# Patient Record
Sex: Female | Born: 1960 | Race: White | Hispanic: No | Marital: Married | Smoking: Current every day smoker
Health system: Southern US, Community
[De-identification: ages and names within clinical notes are randomized; demographics above are authoritative.]

## PROBLEM LIST (undated history)

## (undated) DIAGNOSIS — N200 Calculus of kidney: Secondary | ICD-10-CM

## (undated) HISTORY — PX: FUNCTIONAL ENDOSCOPIC SINUS SURGERY: SUR616

## (undated) HISTORY — PX: BREAST LUMPECTOMY: SHX2

---

## 2010-06-06 HISTORY — PX: BREAST BIOPSY: SHX20

## 2015-12-23 ENCOUNTER — Other Ambulatory Visit: Payer: Self-pay | Admitting: Student

## 2015-12-23 DIAGNOSIS — Z1239 Encounter for other screening for malignant neoplasm of breast: Secondary | ICD-10-CM

## 2016-12-30 ENCOUNTER — Encounter: Payer: Self-pay | Admitting: *Deleted

## 2017-01-02 ENCOUNTER — Encounter
Admission: RE | Disposition: A | Discharge status: Home / Self Care | Payer: Self-pay | Source: Ambulatory Visit | Attending: Unknown Physician Specialty

## 2017-01-02 ENCOUNTER — Encounter: Payer: Self-pay | Admitting: *Deleted

## 2017-01-02 ENCOUNTER — Ambulatory Visit: Payer: BLUE CROSS/BLUE SHIELD | Admitting: Anesthesiology

## 2017-01-02 ENCOUNTER — Ambulatory Visit
Admission: RE | Admit: 2017-01-02 | Discharge: 2017-01-02 | Disposition: A | Discharge status: Home / Self Care | Payer: BLUE CROSS/BLUE SHIELD | Source: Ambulatory Visit | Attending: Unknown Physician Specialty | Admitting: Unknown Physician Specialty

## 2017-01-02 DIAGNOSIS — K640 First degree hemorrhoids: Secondary | ICD-10-CM | POA: Diagnosis not present

## 2017-01-02 DIAGNOSIS — F17210 Nicotine dependence, cigarettes, uncomplicated: Secondary | ICD-10-CM | POA: Diagnosis not present

## 2017-01-02 DIAGNOSIS — Z1211 Encounter for screening for malignant neoplasm of colon: Secondary | ICD-10-CM | POA: Insufficient documentation

## 2017-01-02 DIAGNOSIS — Z87442 Personal history of urinary calculi: Secondary | ICD-10-CM | POA: Insufficient documentation

## 2017-01-02 DIAGNOSIS — Z79899 Other long term (current) drug therapy: Secondary | ICD-10-CM | POA: Insufficient documentation

## 2017-01-02 DIAGNOSIS — K529 Noninfective gastroenteritis and colitis, unspecified: Secondary | ICD-10-CM | POA: Diagnosis not present

## 2017-01-02 DIAGNOSIS — K5730 Diverticulosis of large intestine without perforation or abscess without bleeding: Secondary | ICD-10-CM | POA: Diagnosis not present

## 2017-01-02 DIAGNOSIS — D120 Benign neoplasm of cecum: Secondary | ICD-10-CM | POA: Diagnosis not present

## 2017-01-02 HISTORY — PX: COLONOSCOPY WITH PROPOFOL: SHX5780

## 2017-01-02 HISTORY — DX: Calculus of kidney: N20.0

## 2017-01-02 SURGERY — COLONOSCOPY WITH PROPOFOL
Anesthesia: General

## 2017-01-02 MED ORDER — PROPOFOL 10 MG/ML IV BOLUS
INTRAVENOUS | Status: DC | PRN
  Administered 2017-01-02: 30 mg via INTRAVENOUS
  Administered 2017-01-02 (×2): 20 mg via INTRAVENOUS
  Administered 2017-01-02: 30 mg via INTRAVENOUS

## 2017-01-02 MED ORDER — PROPOFOL 500 MG/50ML IV EMUL
INTRAVENOUS | Status: AC
  Filled 2017-01-02: qty 50

## 2017-01-02 MED ORDER — SODIUM CHLORIDE 0.9 % IV SOLN
INTRAVENOUS | Status: DC
  Administered 2017-01-02: 14:00:00 via INTRAVENOUS

## 2017-01-02 MED ORDER — PROPOFOL 10 MG/ML IV BOLUS
INTRAVENOUS | Status: AC
  Filled 2017-01-02: qty 20

## 2017-01-02 MED ORDER — LIDOCAINE HCL (CARDIAC) 20 MG/ML IV SOLN
INTRAVENOUS | Status: DC | PRN
  Administered 2017-01-02: 30 mg via INTRAVENOUS

## 2017-01-02 MED ORDER — SODIUM CHLORIDE 0.9 % IV SOLN: INTRAVENOUS | Status: DC

## 2017-01-02 MED ORDER — PROPOFOL 500 MG/50ML IV EMUL
INTRAVENOUS | Status: DC | PRN
  Administered 2017-01-02: 140 ug/kg/min via INTRAVENOUS

## 2017-01-02 NOTE — Anesthesia Post-op Follow-up Note (Cosign Needed)
Anesthesia QCDR form completed.        

## 2017-01-02 NOTE — Transfer of Care (Signed)
Immediate Anesthesia Transfer of Care Note  Patient: Nancy Ryan  Procedure(s) Performed: Procedure(s): COLONOSCOPY WITH PROPOFOL (N/A)  Patient Location: PACU  Anesthesia Type:General  Level of Consciousness: sedated  Airway & Oxygen Therapy: Patient Spontanous Breathing and Patient connected to nasal cannula oxygen  Post-op Assessment: Report given to RN and Post -op Vital signs reviewed and stable  Post vital signs: Reviewed and stable  Last Vitals:  Vitals:   01/02/17 1339  BP: 129/81  Pulse: 85  Resp: 16  Temp: 36.7 C    Last Pain:  Vitals:   01/02/17 1339  TempSrc: Tympanic  PainSc: 8          Complications: No apparent anesthesia complications

## 2017-01-02 NOTE — Anesthesia Procedure Notes (Signed)
Date/Time: 01/02/2017 2:03 PM Performed by: Johnna Acosta Pre-anesthesia Checklist: Patient identified, Emergency Drugs available, Suction available, Patient being monitored and Timeout performed Patient Re-evaluated:Patient Re-evaluated prior to induction Oxygen Delivery Method: Nasal cannula

## 2017-01-02 NOTE — Op Note (Signed)
[facility] Gastroenterology Patient Name: Nancy Ryan Procedure Date: 01/02/2017 1:59 PM MRN: [id number] Account #: [id number] Date of Birth: 1960/10/14 Admit Type: Outpatient Age: 56 Room: [facility] ENDO ROOM 3 Gender: Female Note Status: Finalized Procedure:            Colonoscopy Indications:          Screening for colorectal malignant neoplasm Providers:            Manya Silvas, MD Referring MD:         Irven Easterly. Kary Kos, MD (Referring MD) Medicines:            Propofol per Anesthesia Complications:        No immediate complications. Procedure:            Pre-Anesthesia Assessment:                       - After reviewing the risks and benefits, the patient                        was deemed in satisfactory condition to undergo the                        procedure.                       After obtaining informed consent, the colonoscope was                        passed under direct vision. Throughout the procedure,                        the patient's blood pressure, pulse, and oxygen                        saturations were monitored continuously. The                        Colonoscope was introduced through the anus and                        advanced to the the cecum, identified by appendiceal                        orifice and ileocecal valve. The colonoscopy was                        performed without difficulty. The patient tolerated the                        procedure well. The quality of the bowel preparation                        was good. Findings:      Two sessile polyps were found in the cecum. The polyps were diminutive       in size. These polyps were removed with a jumbo cold forceps. Resection       and retrieval were complete.      A single small-mouthed diverticulum was found in the transverse colon.      Internal hemorrhoids were found during endoscopy. The hemorrhoids were       small  and Grade I (internal hemorrhoids that do not  prolapse).      The exam was otherwise without abnormality. Impression:           - Two diminutive polyps in the cecum, removed with a                        jumbo cold forceps. Resected and retrieved.                       - Diverticulosis in the transverse colon.                       - Internal hemorrhoids.                       - The examination was otherwise normal. Recommendation:       - Await pathology results. Manya Silvas, MD 01/02/2017 2:36:50 PM This report has been signed electronically. Number of Addenda: 0 Note Initiated On: 01/02/2017 1:59 PM Scope Withdrawal Time: 0 hours 11 minutes 4 seconds  Total Procedure Duration: 0 hours 22 minutes 19 seconds       [facility]

## 2017-01-02 NOTE — H&P (Signed)
   Primary Care Physician:  Wayland Denis, PA-C Primary Gastroenterologist:  Dr. Vira Agar  Pre-Procedure History & Physical: HPI:  Nancy Ryan is a 56 y.o. female is here for an colonoscopy.   Past Medical History:  Diagnosis Date  . Kidney stone     Past Surgical History:  Procedure Laterality Date  . BREAST LUMPECTOMY Right   . FUNCTIONAL ENDOSCOPIC SINUS SURGERY      Prior to Admission medications   Medication Sig Start Date End Date Taking? Authorizing Provider  atorvastatin (LIPITOR) 10 MG tablet Take 10 mg by mouth daily.   Yes [provider]    Allergies as of 09/20/2016  . (Not on File)    History reviewed. No pertinent family history.  Social History   Social History  . Marital status: Unknown    Spouse name: N/A  . Number of children: N/A  . Years of education: N/A   Occupational History  . Not on file.   Social History Main Topics  . Smoking status: Current Every Day Smoker    Packs/day: 0.50    Years: 20.00  . Smokeless tobacco: Never Used  . Alcohol use No  . Drug use: No  . Sexual activity: Not on file   Other Topics Concern  . Not on file   Social History Narrative  . No narrative on file    Review of Systems: See HPI, otherwise negative ROS  Physical Exam: BP 129/81   Pulse 85   Temp 98 F (36.7 C) (Tympanic)   Resp 16   Ht 5' (1.524 m)   Wt 58.1 kg (128 lb)   LMP 01/02/2009   SpO2 97%   BMI 25.00 kg/m  General:   Alert,  pleasant and cooperative in NAD Head:  Normocephalic and atraumatic. Neck:  Supple; no masses or thyromegaly. Lungs:  Clear throughout to auscultation.    Heart:  Regular rate and rhythm. Abdomen:  Soft, nontender and nondistended. Normal bowel sounds, without guarding, and without rebound.   Neurologic:  Alert and  oriented x4;  grossly normal neurologically.  Impression/Plan: Nancy Ryan is here for an colonoscopy to be performed for colon screening.  Risks, benefits, limitations, and  alternatives regarding  colonoscopy have been reviewed with the patient.  Questions have been answered.  All parties agreeable.   Gaylyn Cheers, MD  01/02/2017, 2:01 PM

## 2017-01-02 NOTE — Anesthesia Preprocedure Evaluation (Signed)
Anesthesia Evaluation  Patient identified by MRN, date of birth, ID band Patient awake    Reviewed: Allergy & Precautions, NPO status , Patient's Chart, lab work & pertinent test results  History of Anesthesia Complications Negative for: history of anesthetic complications  Airway Mallampati: III  TM Distance: >3 FB Neck ROM: Full    Dental no notable dental hx.    Pulmonary neg sleep apnea, neg COPD, Current Smoker,    breath sounds clear to auscultation- rhonchi (-) wheezing      Cardiovascular Exercise Tolerance: Good (-) hypertension(-) CAD and (-) Past MI  Rhythm:Regular Rate:Normal - Systolic murmurs and - Diastolic murmurs    Neuro/Psych negative neurological ROS  negative psych ROS   GI/Hepatic negative GI ROS, Neg liver ROS,   Endo/Other  negative endocrine ROSneg diabetes  Renal/GU Renal disease: hx of nephrolithiasis.     Musculoskeletal negative musculoskeletal ROS (+)   Abdominal (+) - obese,   Peds  Hematology negative hematology ROS (+)   Anesthesia Other Findings Past Medical History: No date: Kidney stone   Reproductive/Obstetrics                             Anesthesia Physical Anesthesia Plan  ASA: II  Anesthesia Plan: General   Post-op Pain Management:    Induction: Intravenous  PONV Risk Score and Plan: 1 and Propofol infusion  Airway Management Planned: Natural Airway  Additional Equipment:   Intra-op Plan:   Post-operative Plan:   Informed Consent: I have reviewed the patients History and Physical, chart, labs and discussed the procedure including the risks, benefits and alternatives for the proposed anesthesia with the patient or authorized representative who has indicated his/her understanding and acceptance.   Dental advisory given  Plan Discussed with: CRNA and Anesthesiologist  Anesthesia Plan Comments:         Anesthesia Quick  Evaluation

## 2017-01-03 ENCOUNTER — Encounter: Payer: Self-pay | Admitting: Unknown Physician Specialty

## 2017-01-03 NOTE — Anesthesia Postprocedure Evaluation (Signed)
Anesthesia Post Note  Patient: Nancy Ryan  Procedure(s) Performed: Procedure(s) (LRB): COLONOSCOPY WITH PROPOFOL (N/A)  Patient location during evaluation: PACU Anesthesia Type: General Level of consciousness: awake and alert Pain management: pain level controlled Vital Signs Assessment: post-procedure vital signs reviewed and stable Respiratory status: spontaneous breathing, nonlabored ventilation, respiratory function stable and patient connected to nasal cannula oxygen Cardiovascular status: blood pressure returned to baseline and stable Postop Assessment: no signs of nausea or vomiting Anesthetic complications: no     Last Vitals:  Vitals:   01/02/17 1458 01/02/17 1508  BP: 108/69 112/68  Pulse: 68 67  Resp: 16 16  Temp:      Last Pain:  Vitals:   01/02/17 1438  TempSrc: Tympanic  PainSc:                  Molli Barrows

## 2017-01-05 LAB — SURGICAL PATHOLOGY

## 2017-12-25 ENCOUNTER — Other Ambulatory Visit: Payer: Self-pay | Admitting: Student

## 2017-12-25 DIAGNOSIS — Z1231 Encounter for screening mammogram for malignant neoplasm of breast: Secondary | ICD-10-CM

## 2018-12-11 ENCOUNTER — Other Ambulatory Visit: Payer: Self-pay | Admitting: Student

## 2018-12-11 DIAGNOSIS — Z1231 Encounter for screening mammogram for malignant neoplasm of breast: Secondary | ICD-10-CM

## 2018-12-19 ENCOUNTER — Ambulatory Visit
Admission: RE | Admit: 2018-12-19 | Discharge: 2018-12-19 | Disposition: A | Discharge status: Home / Self Care | Payer: 59 | Source: Ambulatory Visit | Attending: Student | Admitting: Student

## 2018-12-19 ENCOUNTER — Other Ambulatory Visit: Payer: Self-pay

## 2018-12-19 ENCOUNTER — Encounter (INDEPENDENT_AMBULATORY_CARE_PROVIDER_SITE_OTHER): Payer: Self-pay

## 2018-12-19 DIAGNOSIS — Z1231 Encounter for screening mammogram for malignant neoplasm of breast: Secondary | ICD-10-CM | POA: Diagnosis present

## 2019-09-01 ENCOUNTER — Ambulatory Visit: Payer: 59 | Attending: Internal Medicine

## 2019-09-01 ENCOUNTER — Other Ambulatory Visit: Payer: Self-pay

## 2019-09-01 DIAGNOSIS — Z23 Encounter for immunization: Secondary | ICD-10-CM

## 2019-09-01 NOTE — Progress Notes (Signed)
   Covid-19 Vaccination Clinic  Name:  Nancy Ryan    MRN: [id number] DOB: 17-Jun-1960  09/01/2019  Ms. Vital was observed post Covid-19 immunization for 15 minutes without incident. She was provided with Vaccine Information Sheet and instruction to access the V-Safe system.   [name] was instructed to call 911 with any severe reactions post vaccine: Marland Kitchen Difficulty breathing  . Swelling of face and throat  . A fast heartbeat  . A bad rash all over body  . Dizziness and weakness   Immunizations Administered    Name Date Dose VIS Date Route   Pfizer COVID-19 Vaccine 09/01/2019 10:26 AM 0.3 mL 05/17/2019 Intramuscular   Manufacturer: [employer], [employer]   Lot: [id number]   [place]: [id number]

## 2019-09-24 ENCOUNTER — Ambulatory Visit: Payer: 59 | Attending: Internal Medicine

## 2019-09-24 DIAGNOSIS — Z23 Encounter for immunization: Secondary | ICD-10-CM

## 2019-09-24 NOTE — Progress Notes (Signed)
   Covid-19 Vaccination Clinic  Name:  Nancy Ryan    MRN: [id number] DOB: Jan 13, 1961  09/24/2019  [name] was observed post Covid-19 immunization for 15 minutes without incident. She was provided with Vaccine Information Sheet and instruction to access the V-Safe system.   [name] was instructed to call 911 with any severe reactions post vaccine: Marland Kitchen Difficulty breathing  . Swelling of face and throat  . A fast heartbeat  . A bad rash all over body  . Dizziness and weakness   Immunizations Administered    Name Date Dose VIS Date Route   Pfizer COVID-19 Vaccine 09/24/2019  3:58 PM 0.3 mL 07/31/2018 Intramuscular   Manufacturer: [place]   Lot: [id number]   [place]: [id number]

## 2019-12-10 ENCOUNTER — Other Ambulatory Visit: Payer: Self-pay | Admitting: Student

## 2019-12-10 DIAGNOSIS — Z1231 Encounter for screening mammogram for malignant neoplasm of breast: Secondary | ICD-10-CM

## 2019-12-24 ENCOUNTER — Ambulatory Visit
Admission: RE | Admit: 2019-12-24 | Discharge: 2019-12-24 | Disposition: A | Discharge status: Home / Self Care | Payer: 59 | Source: Ambulatory Visit | Attending: Student | Admitting: Student

## 2019-12-24 ENCOUNTER — Encounter (INDEPENDENT_AMBULATORY_CARE_PROVIDER_SITE_OTHER): Payer: Self-pay

## 2019-12-24 ENCOUNTER — Other Ambulatory Visit: Payer: Self-pay

## 2019-12-24 DIAGNOSIS — Z1231 Encounter for screening mammogram for malignant neoplasm of breast: Secondary | ICD-10-CM

## 2021-01-30 ENCOUNTER — Ambulatory Visit
Admission: EM | Admit: 2021-01-30 | Discharge: 2021-01-30 | Disposition: A | Discharge status: Home / Self Care | Payer: 59 | Attending: Family Medicine | Admitting: Family Medicine

## 2021-01-30 ENCOUNTER — Other Ambulatory Visit: Payer: Self-pay

## 2021-01-30 ENCOUNTER — Encounter: Payer: Self-pay | Admitting: Emergency Medicine

## 2021-01-30 DIAGNOSIS — H1032 Unspecified acute conjunctivitis, left eye: Secondary | ICD-10-CM

## 2021-01-30 MED ORDER — ERYTHROMYCIN 5 MG/GM OP OINT
TOPICAL_OINTMENT | OPHTHALMIC | 0 refills | Status: DC
Start: 2021-01-30 — End: 2022-08-18

## 2021-01-30 NOTE — ED Provider Notes (Signed)
Roderic Palau    CSN: [id number] Arrival date & time: 01/30/21  0808      History   Chief Complaint Chief Complaint  Patient presents with   Eye Problem    HPI Nancy Ryan is a 60 y.o. female.   HPI Patient presents today with 1 day of left eye irritation and redness.  Patient works in a childcare facility and reports noticing the irritation as the day progressed yesterday.  She denies any swelling of the eyelids or irritation of the eyelid.  Upon awakening this morning she endorses crusting and drainage of the left eye.  Right eye is unaffected.  She denies any URI symptoms or visual changes Past Medical History:  Diagnosis Date   Kidney stone     There are no problems to display for this patient.   Past Surgical History:  Procedure Laterality Date   BREAST BIOPSY Right 2012   neg   BREAST LUMPECTOMY Right    COLONOSCOPY WITH PROPOFOL N/A 01/02/2017   Procedure: COLONOSCOPY WITH PROPOFOL;  Surgeon: Manya Silvas, MD;  [facility] ENDOSCOPY;  Service: Endoscopy;  Laterality: N/A;   FUNCTIONAL ENDOSCOPIC SINUS SURGERY      OB History   No obstetric history on file.      Home Medications    Prior to Admission medications   Medication Sig Start Date End Date Taking? Authorizing Provider  erythromycin ophthalmic ointment Place a 1/2 inch ribbon of ointment into the lower left eyelid every 12 hours for 7 days 01/30/21  Yes Scot Jun, FNP  atorvastatin (LIPITOR) 10 MG tablet Take 10 mg by mouth daily.    [provider]    Family History Family History  Problem Relation Age of Onset   Breast cancer Neg Hx     Social History Social History   Tobacco Use   Smoking status: Every Day    Packs/day: 0.50    Years: 20.00    Pack years: 10.00    Types: Cigarettes   Smokeless tobacco: Never  Substance Use Topics   Alcohol use: No   Drug use: No     Allergies   Sulfa antibiotics   Review of Systems Review of  Systems Pertinent negatives listed in HPI  Physical Exam Triage Vital Signs ED Triage Vitals  Enc Vitals Group     BP 01/30/21 0822 (!) 144/85     Pulse Rate 01/30/21 0822 78     Resp 01/30/21 0822 18     Temp 01/30/21 0822 98.1 F (36.7 C)     Temp Source 01/30/21 0822 Oral     SpO2 01/30/21 0822 97 %     Weight --      Height --      Head Circumference --      Peak Flow --      Pain Score 01/30/21 0825 4     Pain Loc --      Pain Edu? --      Excl. in [place]? --    No data found.  Updated Vital Signs BP (!) 144/85 (BP Location: Left Arm)   Pulse 78   Temp 98.1 F (36.7 C) (Oral)   Resp 18   LMP 01/02/2009   SpO2 97%   Visual Acuity Right Eye Distance:   Left Eye Distance:   Bilateral Distance:    Right Eye Near:   Left Eye Near:    Bilateral Near:     Physical Exam Constitutional:  Appearance: Normal appearance.  Eyes:     Conjunctiva/sclera:     Left eye: Left conjunctiva is injected. Exudate and hemorrhage present.  Cardiovascular:     Rate and Rhythm: Normal rate and regular rhythm.  Pulmonary:     Effort: Pulmonary effort is normal.     Breath sounds: Normal breath sounds.  Skin:    Capillary Refill: Capillary refill takes less than 2 seconds.  Neurological:     General: No focal deficit present.     Mental Status: She is alert and oriented to person, place, and time. Mental status is at baseline.  Psychiatric:        Mood and Affect: Mood normal.        Behavior: Behavior normal.        Thought Content: Thought content normal.        Judgment: Judgment normal.     UC Treatments / Results  Labs (all labs ordered are listed, but only abnormal results are displayed) Labs Reviewed - No data to display  EKG   Radiology No results found.  Procedures Procedures (including critical care time)  Medications Ordered in UC Medications - No data to display  Initial Impression / Assessment and Plan / UC Course  I have reviewed the triage  vital signs and the nursing notes.  Pertinent labs & imaging results that were available during my care of the patient were reviewed by me and considered in my medical decision making (see chart for details).     Acute bacterial conjunctivitis involving the left eye.  Treatment with erythromycin twice daily x7 days.  Return precautions given if symptoms worsen or do not improve.  Final Clinical Impressions(s) / UC Diagnoses   Final diagnoses:  Acute bacterial conjunctivitis of left eye   Discharge Instructions   None    ED Prescriptions     Medication Sig Dispense Auth. Provider   erythromycin ophthalmic ointment Place a 1/2 inch ribbon of ointment into the lower left eyelid every 12 hours for 7 days 3.5 g Scot Jun, FNP      PDMP not reviewed this encounter.   Scot Jun, [place] 01/30/21 [phone]

## 2021-01-30 NOTE — ED Triage Notes (Signed)
Pt here with left eye redness, swelling and drainage since yesterday. 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 No vision changes.

## 2021-08-23 IMAGING — MG DIGITAL SCREENING BILAT W/ TOMO W/ CAD
8 series · 8 of 24 positions shown · non-contrast
Comparison: Previous exam(s).

CLINICAL DATA: Screening.

EXAM:
DIGITAL SCREENING BILATERAL MAMMOGRAM WITH TOMO AND CAD

[L MLO synth-2D]
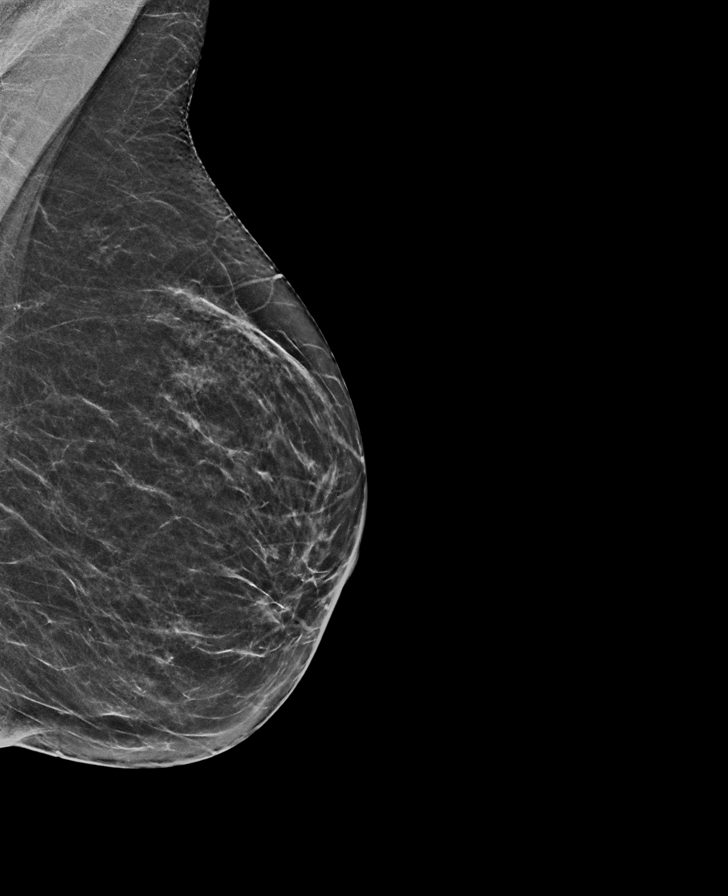

[R MLO synth-2D]
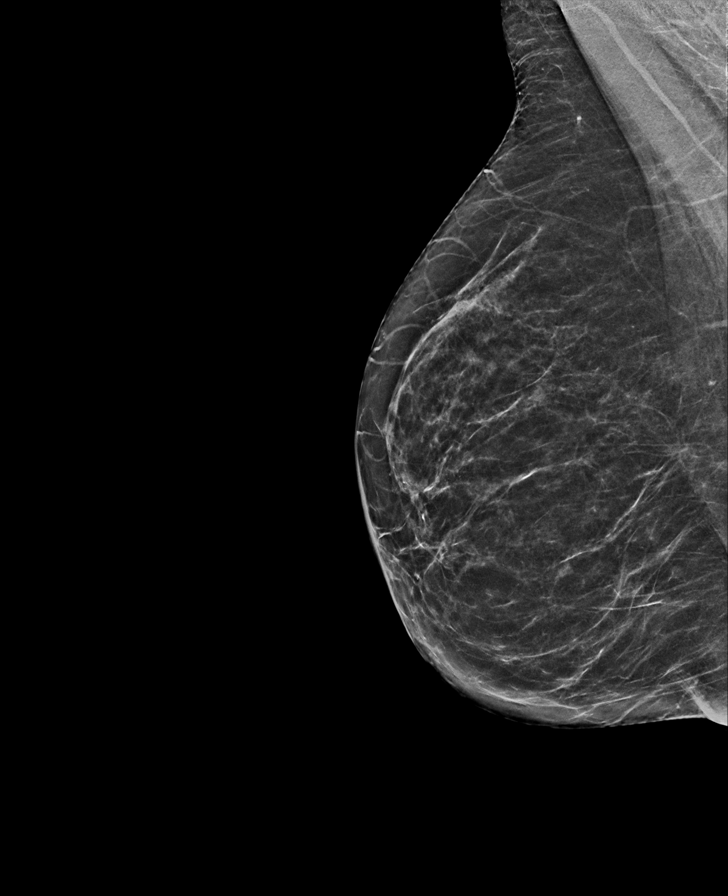

[R CC synth-2D]
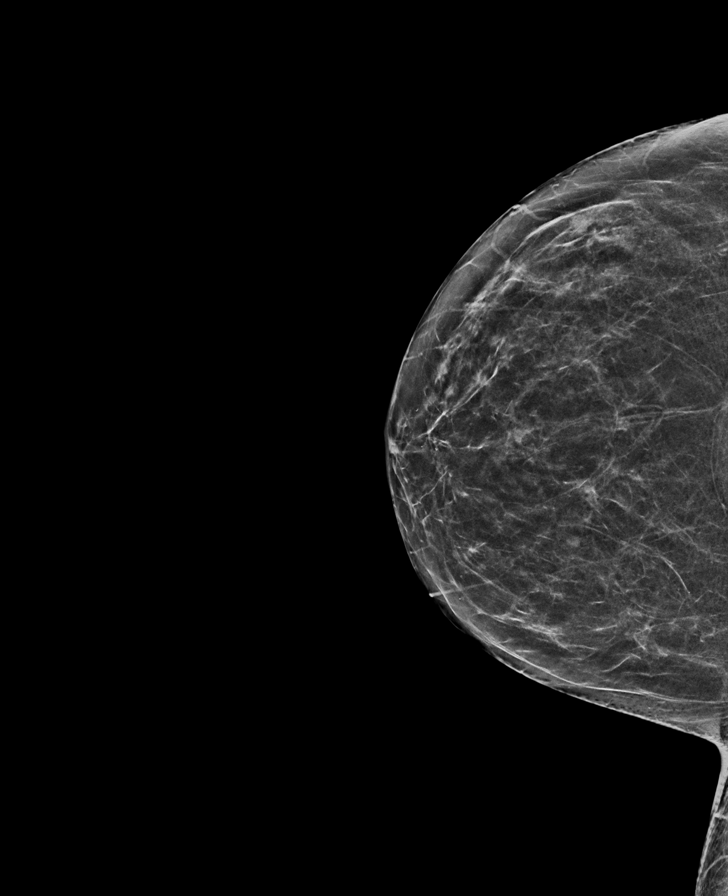

[L CC synth-2D]
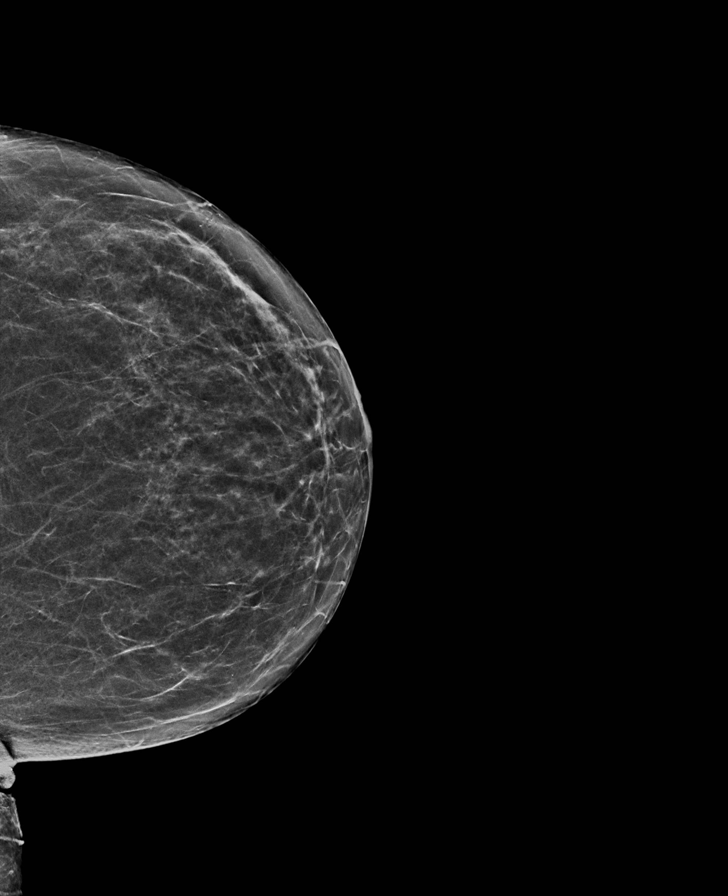

[L CC tomo · tomo slice 30/59.0]
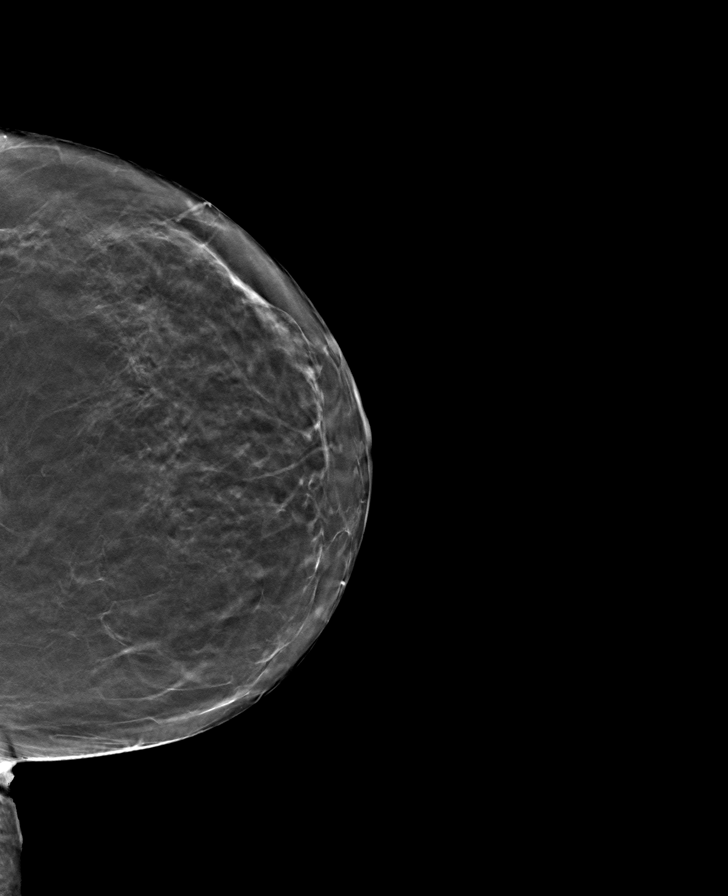

[R CC tomo · tomo slice 29/58.0]
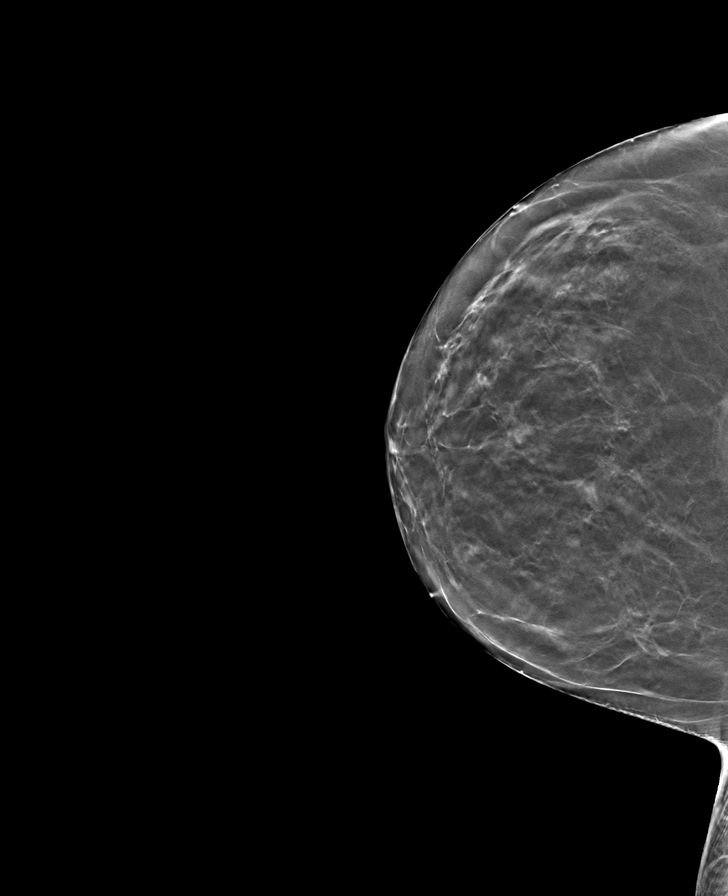

[L MLO tomo · tomo slice 29/57.0]
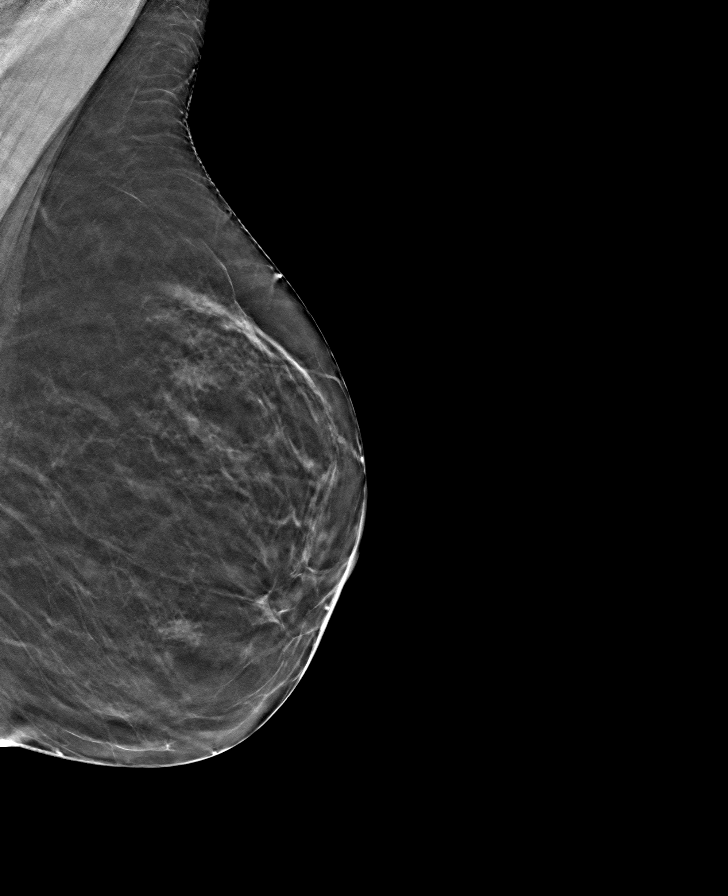

[R MLO tomo · tomo slice 29/57.0]
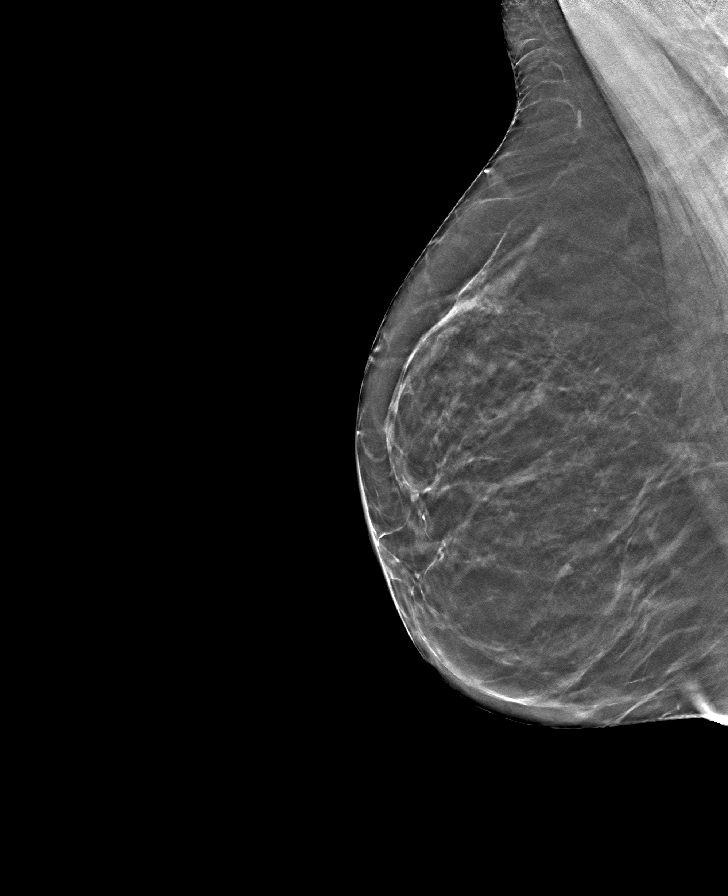

[8 of 24 positions shown; findings below may reference images not displayed]

ACR Breast Density Category b: There are scattered areas of
fibroglandular density.
FINDINGS: There are no findings suspicious for malignancy. Images were
processed with CAD.
IMPRESSION: No mammographic evidence of malignancy. A result letter of this
screening mammogram will be mailed directly to the patient.

RECOMMENDATION:
Screening mammogram in one year. (Code:[id number])

BI-RADS CATEGORY  1: Negative.

## 2021-12-10 ENCOUNTER — Other Ambulatory Visit: Payer: Self-pay | Admitting: Student

## 2021-12-10 DIAGNOSIS — Z1231 Encounter for screening mammogram for malignant neoplasm of breast: Secondary | ICD-10-CM

## 2022-08-17 ENCOUNTER — Emergency Department: Payer: Commercial Managed Care - PPO

## 2022-08-17 ENCOUNTER — Other Ambulatory Visit: Payer: Self-pay

## 2022-08-17 ENCOUNTER — Encounter: Payer: Self-pay | Admitting: Family Medicine

## 2022-08-17 ENCOUNTER — Observation Stay: Payer: Commercial Managed Care - PPO

## 2022-08-17 ENCOUNTER — Observation Stay (HOSPITAL_BASED_OUTPATIENT_CLINIC_OR_DEPARTMENT_OTHER)
Admit: 2022-08-17 | Discharge: 2022-08-17 | Disposition: A | Discharge status: Home / Self Care | Payer: Commercial Managed Care - PPO | Attending: Family Medicine | Admitting: Family Medicine

## 2022-08-17 ENCOUNTER — Observation Stay
Admission: EM | Admit: 2022-08-17 | Discharge: 2022-08-18 | Disposition: A | Discharge status: Home / Self Care | Payer: Commercial Managed Care - PPO | Attending: Student | Admitting: Student

## 2022-08-17 DIAGNOSIS — R9431 Abnormal electrocardiogram [ECG] [EKG]: Secondary | ICD-10-CM | POA: Diagnosis not present

## 2022-08-17 DIAGNOSIS — Z716 Tobacco abuse counseling: Secondary | ICD-10-CM | POA: Diagnosis not present

## 2022-08-17 DIAGNOSIS — Z1152 Encounter for screening for COVID-19: Secondary | ICD-10-CM | POA: Insufficient documentation

## 2022-08-17 DIAGNOSIS — E1169 Type 2 diabetes mellitus with other specified complication: Secondary | ICD-10-CM

## 2022-08-17 DIAGNOSIS — E785 Hyperlipidemia, unspecified: Secondary | ICD-10-CM | POA: Diagnosis not present

## 2022-08-17 DIAGNOSIS — G459 Transient cerebral ischemic attack, unspecified: Principal | ICD-10-CM | POA: Insufficient documentation

## 2022-08-17 DIAGNOSIS — J019 Acute sinusitis, unspecified: Secondary | ICD-10-CM | POA: Insufficient documentation

## 2022-08-17 DIAGNOSIS — E119 Type 2 diabetes mellitus without complications: Secondary | ICD-10-CM | POA: Diagnosis not present

## 2022-08-17 DIAGNOSIS — J329 Chronic sinusitis, unspecified: Secondary | ICD-10-CM | POA: Insufficient documentation

## 2022-08-17 DIAGNOSIS — F17200 Nicotine dependence, unspecified, uncomplicated: Secondary | ICD-10-CM | POA: Diagnosis not present

## 2022-08-17 DIAGNOSIS — Z720 Tobacco use: Secondary | ICD-10-CM | POA: Insufficient documentation

## 2022-08-17 DIAGNOSIS — Z79899 Other long term (current) drug therapy: Secondary | ICD-10-CM | POA: Diagnosis not present

## 2022-08-17 DIAGNOSIS — R531 Weakness: Secondary | ICD-10-CM

## 2022-08-17 LAB — CBC WITH DIFFERENTIAL/PLATELET
Abs Immature Granulocytes: 0.03 10*3/uL (ref 0.00–0.07)
Basophils Absolute: 0.1 10*3/uL (ref 0.0–0.1)
Basophils Relative: 1 %
Eosinophils Absolute: 0.1 10*3/uL (ref 0.0–0.5)
Eosinophils Relative: 1 %
HCT: 41.1 % (ref 36.0–46.0)
Hemoglobin: 13.1 g/dL (ref 12.0–15.0)
Immature Granulocytes: 0 %
Lymphocytes Relative: 18 %
Lymphs Abs: 2.1 10*3/uL (ref 0.7–4.0)
MCH: 29.8 pg (ref 26.0–34.0)
MCHC: 31.9 g/dL (ref 30.0–36.0)
MCV: 93.6 fL (ref 80.0–100.0)
Monocytes Absolute: 0.7 10*3/uL (ref 0.1–1.0)
Monocytes Relative: 6 %
Neutro Abs: 8.5 10*3/uL — ABNORMAL HIGH (ref 1.7–7.7)
Neutrophils Relative %: 74 %
Platelets: 320 10*3/uL (ref 150–400)
RBC: 4.39 MIL/uL (ref 3.87–5.11)
RDW: 13.1 % (ref 11.5–15.5)
WBC: 11.6 10*3/uL — ABNORMAL HIGH (ref 4.0–10.5)
nRBC: 0 % (ref 0.0–0.2)

## 2022-08-17 LAB — RESP PANEL BY RT-PCR (RSV, FLU A&B, COVID)  RVPGX2
Influenza A by PCR: NEGATIVE
Influenza B by PCR: NEGATIVE
Resp Syncytial Virus by PCR: NEGATIVE
SARS Coronavirus 2 by RT PCR: NEGATIVE

## 2022-08-17 LAB — URINALYSIS, ROUTINE W REFLEX MICROSCOPIC
Bilirubin Urine: NEGATIVE
Glucose, UA: NEGATIVE mg/dL
Hgb urine dipstick: NEGATIVE
Ketones, ur: NEGATIVE mg/dL
Leukocytes,Ua: NEGATIVE
Nitrite: NEGATIVE
Protein, ur: NEGATIVE mg/dL
Specific Gravity, Urine: 1.004 — ABNORMAL LOW (ref 1.005–1.030)
pH: 7 (ref 5.0–8.0)

## 2022-08-17 LAB — URINE DRUG SCREEN, QUALITATIVE (ARMC ONLY)
Amphetamines, Ur Screen: NOT DETECTED
Barbiturates, Ur Screen: NOT DETECTED
Benzodiazepine, Ur Scrn: NOT DETECTED
Cannabinoid 50 Ng, Ur ~~LOC~~: NOT DETECTED
Cocaine Metabolite,Ur ~~LOC~~: NOT DETECTED
MDMA (Ecstasy)Ur Screen: NOT DETECTED
Methadone Scn, Ur: NOT DETECTED
Opiate, Ur Screen: NOT DETECTED
Phencyclidine (PCP) Ur S: NOT DETECTED
Tricyclic, Ur Screen: NOT DETECTED

## 2022-08-17 LAB — COMPREHENSIVE METABOLIC PANEL
ALT: 20 U/L (ref 0–44)
AST: 19 U/L (ref 15–41)
Albumin: 3.9 g/dL (ref 3.5–5.0)
Alkaline Phosphatase: 80 U/L (ref 38–126)
Anion gap: 5 (ref 5–15)
BUN: 19 mg/dL (ref 8–23)
CO2: 27 mmol/L (ref 22–32)
Calcium: 8.6 mg/dL — ABNORMAL LOW (ref 8.9–10.3)
Chloride: 105 mmol/L (ref 98–111)
Creatinine, Ser: 0.67 mg/dL (ref 0.44–1.00)
GFR, Estimated: >60 mL/min (ref >60)
Glucose, Bld: 176 mg/dL — ABNORMAL HIGH (ref 70–99)
Potassium: 3.6 mmol/L (ref 3.5–5.1)
Sodium: 137 mmol/L (ref 135–145)
Total Bilirubin: 0.4 mg/dL (ref 0.3–1.2)
Total Protein: 6.9 g/dL (ref 6.5–8.1)

## 2022-08-17 LAB — ETHANOL: Alcohol, Ethyl (B): <10 mg/dL (ref <10)

## 2022-08-17 LAB — T4, FREE: Free T4: 0.98 ng/dL (ref 0.61–1.12)

## 2022-08-17 LAB — TROPONIN I (HIGH SENSITIVITY)
Troponin I (High Sensitivity): 3 ng/L (ref <18)
Troponin I (High Sensitivity): 4 ng/L (ref <18)

## 2022-08-17 LAB — HEMOGLOBIN A1C
Hgb A1c MFr Bld: 6.3 % — ABNORMAL HIGH (ref 4.8–5.6)
Mean Plasma Glucose: 134 mg/dL

## 2022-08-17 LAB — GLUCOSE, CAPILLARY
Glucose-Capillary: 92 mg/dL (ref 70–99)
Glucose-Capillary: 99 mg/dL (ref 70–99)

## 2022-08-17 LAB — CBG MONITORING, ED
Glucose-Capillary: 123 mg/dL — ABNORMAL HIGH (ref 70–99)
Glucose-Capillary: 91 mg/dL (ref 70–99)

## 2022-08-17 LAB — CK: Total CK: 76 U/L (ref 38–234)

## 2022-08-17 LAB — TSH: TSH: 1.49 u[IU]/mL (ref 0.350–4.500)

## 2022-08-17 MED ORDER — LORAZEPAM 0.5 MG PO TABS: 0.5000 mg | ORAL_TABLET | Freq: Four times a day (QID) | ORAL | Status: DC | PRN

## 2022-08-17 MED ORDER — LORATADINE 10 MG PO TABS
10.0000 mg | ORAL_TABLET | Freq: Every day | ORAL | Status: DC
  Administered 2022-08-17 – 2022-08-18 (×2): 10 mg via ORAL
  Filled 2022-08-17 (×2): qty 1

## 2022-08-17 MED ORDER — STROKE: EARLY STAGES OF RECOVERY BOOK: Freq: Once | Status: AC

## 2022-08-17 MED ORDER — INSULIN ASPART 100 UNIT/ML IJ SOLN
3.0000 [IU] | Freq: Three times a day (TID) | INTRAMUSCULAR | Status: DC
  Filled 2022-08-17: qty 1

## 2022-08-17 MED ORDER — INSULIN ASPART 100 UNIT/ML IJ SOLN: 0.0000 [IU] | Freq: Three times a day (TID) | INTRAMUSCULAR | Status: DC

## 2022-08-17 MED ORDER — SODIUM CHLORIDE 0.9 % IV BOLUS
1000.0000 mL | Freq: Once | INTRAVENOUS | Status: AC
  Administered 2022-08-17: 1000 mL via INTRAVENOUS

## 2022-08-17 MED ORDER — ASPIRIN 325 MG PO TABS: 325.0000 mg | ORAL_TABLET | Freq: Every day | ORAL | Status: DC

## 2022-08-17 MED ORDER — NICOTINE 7 MG/24HR TD PT24
7.0000 mg | MEDICATED_PATCH | Freq: Every day | TRANSDERMAL | Status: DC
  Administered 2022-08-17 – 2022-08-18 (×2): 7 mg via TRANSDERMAL
  Filled 2022-08-17 (×2): qty 1

## 2022-08-17 MED ORDER — ATORVASTATIN CALCIUM 20 MG PO TABS
80.0000 mg | ORAL_TABLET | Freq: Every day | ORAL | Status: DC
  Administered 2022-08-18: 80 mg via ORAL
  Filled 2022-08-17 (×2): qty 4

## 2022-08-17 MED ORDER — ASPIRIN 300 MG RE SUPP: 300.0000 mg | Freq: Every day | RECTAL | Status: DC

## 2022-08-17 MED ORDER — GADOBUTROL 1 MMOL/ML IV SOLN
6.0000 mL | Freq: Once | INTRAVENOUS | Status: AC | PRN
  Administered 2022-08-17: 6 mL via INTRAVENOUS

## 2022-08-17 MED ORDER — FLUTICASONE PROPIONATE 50 MCG/ACT NA SUSP
2.0000 | Freq: Every day | NASAL | Status: DC
  Administered 2022-08-17 – 2022-08-18 (×2): 2 via NASAL
  Filled 2022-08-17: qty 16

## 2022-08-17 MED ORDER — ASPIRIN 81 MG PO TBEC
81.0000 mg | DELAYED_RELEASE_TABLET | Freq: Every day | ORAL | Status: DC
  Administered 2022-08-18: 81 mg via ORAL
  Filled 2022-08-17: qty 1

## 2022-08-17 MED ORDER — LORAZEPAM 0.5 MG PO TABS
0.5000 mg | ORAL_TABLET | Freq: Once | ORAL | Status: AC
  Administered 2022-08-17: 0.5 mg via ORAL
  Filled 2022-08-17: qty 1

## 2022-08-17 NOTE — ED Notes (Signed)
Pt to CT

## 2022-08-17 NOTE — Assessment & Plan Note (Signed)
Noted nonspecific ST depressions in the inferior leads on EKG Unclear if this is a new issue versus chronic No active chest pain Troponin negative x 2 Pending 2D echo in the setting of TIA evaluation Will otherwise monitor for now Cardiology consult as clinically indicated Follow closely

## 2022-08-17 NOTE — Assessment & Plan Note (Signed)
Blood sugar 120s to 170s on presentation Sliding-scale insulin A1c pending

## 2022-08-17 NOTE — Assessment & Plan Note (Signed)
Titrate Lipitor dosing Lipid panel pending Follow

## 2022-08-17 NOTE — Assessment & Plan Note (Addendum)
Positive transient right-sided upper and lower weakness while driving as well as malaise around 630 this morning that last for several minutes Per patient, symptoms progressed resolved by time of EMS evaluation Noted risk factors include borderline diabetes, hyperlipidemia, tobacco abuse CT head within normal limits Will plan for formal GI evaluation including MRI, MRA, 2D echo, carotid Dopplers, restratification labs Start on baby aspirin Discussed tobacco cessation Follow

## 2022-08-17 NOTE — Assessment & Plan Note (Signed)
Noted white count 11.6 with paranasal inflammation on imaging with malaise in setting of TIA evaluation Will place on doxycycline for sinusitis coverage Risk factors include tobacco abuse Follow

## 2022-08-17 NOTE — Progress Notes (Signed)
SLP Cancellation Note  Patient Details Name: Vonne Joanis MRN: [id number] DOB: November 16, 1960   Cancelled treatment:       Reason Eval/Treat Not Completed: SLP screened, no needs identified, will sign off (chart reviewed; consulted NSG and met w/ pt/husband in room)  Pt denied any difficulty swallowing and is currently on a regular diet; tolerates swallowing pills w/ water per NSG. Pt sitting water during visit. Pt conversed in conversation w/out expressive/receptive deficits noted; pt denied any speech-language deficits. Speech clear. Husband agreed. No further skilled ST services indicated as pt appears at her baseline. Pt agreed. NSG to reconsult if any change in status while admitted.     Orinda Kenner, MS, CCC-SLP Speech Language Pathologist Rehab Services; [place] [phone] (ascom) Monalisa Bayless 08/17/2022, 4:42 PM

## 2022-08-17 NOTE — Evaluation (Signed)
Physical Therapy Evaluation Patient Details Name: Nancy Ryan MRN: [id number] DOB: 1960-07-28 Today's Date: 08/17/2022  History of Present Illness  Nancy Ryan is a 62 y.o. female with medical history significant of tobacco abuse, hyperlipidemia, borderline diabetes presenting with TIA.  Patient reports waking up this morning around 5 AM.  Patient states she had felt overall lethargic specially while taking a shower.  Symptoms persisted.  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  Symptoms persisted for multiple minutes.  Symptoms then recurred around 630.  EMS was subsequently called.  Patient denies any fevers or chills.  No chest pain.  No slurred speech or confusion.  Patient has significant difficulty driving the car because of the right upper extremity and lower extremity weakness.  Denies any prior episodes like this in the past.  No vision changes.  No numbness or tingling.  Baseline 1/4 to 1/2 pack/day smoker.  Also baseline hyperlipidemia.  Reports prior borderline diabetes diagnosis has been otherwise monitored outpatient.  No shortness of breath.  No orthopnea or PND.  Clinical Impression  Pt is a pleasant 62 year old female who was admitted for possible TIA. Pt demonstrates all bed mobility/transfers/ambulation at baseline level. No issues with numbness or tingling and normal RAMPs. Pt does not require any further PT needs at this time. Pt will be dc in house and does not require follow up. RN aware. Will dc current orders.      Recommendations for follow up therapy are one component of a multi-disciplinary discharge planning process, led by the attending physician.  Recommendations may be updated based on patient status, additional functional criteria and insurance authorization.  Follow Up Recommendations No PT follow up      Assistance Recommended at Discharge None   Patient can return home with the following       Equipment Recommendations None recommended by PT  Recommendations for Other Services       Functional Status Assessment Patient has not had a recent decline in their functional status     Precautions / Restrictions Precautions Precautions: Fall Restrictions Weight Bearing Restrictions: No      Mobility  Bed Mobility Overal bed mobility: Independent             General bed mobility comments: safe technique with no issues    Transfers Overall transfer level: Independent Equipment used: None               General transfer comment: safe technique    Ambulation/Gait Ambulation/Gait assistance: Independent Gait Distance (Feet): 200 Feet Assistive device: None Gait Pattern/deviations: WFL(Within Functional Limits)       General Gait Details: ambulated using no AD with good speed. No LOB or dizziness noted  Stairs            Wheelchair Mobility    Modified Rankin (Stroke Patients Only)       Balance                                 Standardized Balance Assessment Standardized Balance Assessment : Dynamic Gait Index   Dynamic Gait Index Level Surface: Normal Change in Gait Speed: Normal Gait with Horizontal Head Turns: Normal Gait with Vertical Head Turns: Normal       Pertinent Vitals/Pain Pain Assessment Pain Assessment: No/denies pain    Home Living Family/patient  expects to be discharged to:: Private residence Living Arrangements: Spouse/significant other Available Help at Discharge: Family                    Prior Function Prior Level of Function : Independent/Modified Independent             Mobility Comments: active, exercises, 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 ADLs Comments: indep with all ADLs     Hand Dominance        Extremity/Trunk Assessment   Upper Extremity Assessment Upper Extremity Assessment: Overall WFL for tasks assessed     Lower Extremity Assessment Lower Extremity Assessment: Overall WFL for tasks assessed       Communication   Communication: No difficulties  Cognition Arousal/Alertness: Awake/alert Behavior During Therapy: WFL for tasks assessed/performed Overall Cognitive Status: Within Functional Limits for tasks assessed                                          General Comments      Exercises Other Exercises Other Exercises: ambulated to bathroom. Able to perform toileting and hygiene safely with independence   Assessment/Plan    PT Assessment Patient does not need any further PT services  PT Problem List         PT Treatment Interventions      PT Goals (Current goals can be found in the Care Plan section)  Acute Rehab PT Goals Patient Stated Goal: to go home PT Goal Formulation: All assessment and education complete, DC therapy Time For Goal Achievement: 08/17/22 Potential to Achieve Goals: Good    Frequency       Co-evaluation               AM-PAC PT "6 Clicks" Mobility  Outcome Measure Help needed turning from your back to your side while in a flat bed without using bedrails?: None Help needed moving from lying on your back to sitting on the side of a flat bed without using bedrails?: None Help needed moving to and from a bed to a chair (including a wheelchair)?: None Help needed standing up from a chair using your arms (e.g., wheelchair or bedside chair)?: None Help needed to walk in hospital room?: None Help needed climbing 3-5 steps with a railing? : None 6 Click Score: 24    End of Session   Activity Tolerance: Patient tolerated treatment well Patient left: in bed Nurse Communication: Mobility status PT Visit Diagnosis: Muscle weakness (generalized) (M62.81)    Time: [id number] PT Time Calculation (min) (ACUTE ONLY): 13 min   Charges:   PT Evaluation $PT Eval Low Complexity: 1 Low          Greggory Stallion, PT, DPT,  [facility] [phone]   Nancy Ryan 08/17/2022, 3:44 PM

## 2022-08-17 NOTE — ED Triage Notes (Signed)
Pt arrives by [facility] from work for weakness on [address] [place] that started this morning upon waking up around 4am.  Pt reports taking nyquil last night for cold.  Denies any headache, blurred vision, dizziness.  Pt states something felt "off". VSS per EMS.

## 2022-08-17 NOTE — H&P (Addendum)
History and Physical    PatientMarland Kitchen Nancy Ryan [id number] DOB: Oct 17, 1960 DOA: 08/17/2022 DOS: the patient was seen and examined on 08/17/2022 PCP: Wayland Denis, PA-C  Patient coming from: Home  Chief Complaint:  Chief Complaint  Patient presents with   Weakness   HPI: Nancy Ryan is a 62 y.o. female with medical history significant of tobacco abuse, hyperlipidemia, borderline diabetes presenting with TIA.  Patient reports waking up this morning around 5 AM.  Patient states she had felt overall lethargic specially while taking a shower.  Symptoms persisted.  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  Symptoms persisted for multiple minutes.  Symptoms then recurred around 630.  EMS was subsequently called.  Patient denies any fevers or chills.  No chest pain.  No slurred speech or confusion.  Patient has significant difficulty driving the car because of the right upper extremity and lower extremity weakness.  Denies any prior episodes like this in the past.  No vision changes.  No numbness or tingling.  Baseline 1/4 to 1/2 pack/day smoker.  Also baseline hyperlipidemia.  Reports prior borderline diabetes diagnosis has been otherwise monitored outpatient.  No shortness of breath.  No orthopnea or PND. Presented to the ER afebrile, hemodynamically stable.  Labs notable for white count of 11.6, hemoglobin 13.1.  Blood sugars in 170s to 120s.  Creatinine 0.7.  Troponin negative x 2.  Urinalysis grossly within normal limits.  Urine drug screen pan negative.  COVID flu and RSV negative.  CT head grossly stable apart from moderate mucosal thickening in the paranasal sinuses.  Chest x-ray within normal limits.  EKG with nonspecific ST depressions in the inferior leads.  Otherwise stable. Review of Systems: As mentioned in the history of present illness. All other systems reviewed and are  negative. Past Medical History:  Diagnosis Date   Kidney stone    Past Surgical History:  Procedure Laterality Date   BREAST BIOPSY Right 2012   neg   BREAST LUMPECTOMY Right    COLONOSCOPY WITH PROPOFOL N/A 01/02/2017   Procedure: COLONOSCOPY WITH PROPOFOL;  Surgeon: Manya Silvas, MD;  Location: [facility] ENDOSCOPY;  Service: Endoscopy;  Laterality: N/A;   FUNCTIONAL ENDOSCOPIC SINUS SURGERY     Social History:  reports that she has been smoking. She has a 10.00 pack-year smoking history. She has never used smokeless tobacco. She reports that she does not drink alcohol and does not use drugs.  Allergies  Allergen Reactions   Sulfa Antibiotics     Family History  Problem Relation Age of Onset   Breast cancer Neg Hx     Prior to Admission medications   Medication Sig Start Date End Date Taking? Authorizing Provider  atorvastatin (LIPITOR) 10 MG tablet Take 10 mg by mouth daily.   Yes [provider]  cetirizine (ZYRTEC) 10 MG tablet Take 10 mg by mouth at bedtime.   Yes [provider]  fluticasone (FLONASE) 50 MCG/ACT nasal spray Place 2 sprays into both nostrils in the morning and at bedtime.   Yes [provider]  erythromycin ophthalmic ointment Place a 1/2 inch ribbon of ointment into the lower left eyelid every 12 hours for 7 days Patient not taking: Reported on 08/17/2022 01/30/21   Scot Jun, NP    Physical Exam: Vitals:   08/17/22 0804 08/17/22 0930 08/17/22 1000 08/17/22 1030  BP: 125/82 127/67 122/74 134/73  Pulse:  75 75 74  Resp:  11 (!) 25 16  Temp:      TempSrc:      SpO2:  96% 97% 98%   Physical Exam Constitutional:      General: She is not in acute distress.    Appearance: She is normal weight.  HENT:     Head: Normocephalic and atraumatic.     Mouth/Throat:     Mouth: Mucous membranes are moist.  Eyes:     Pupils: Pupils are equal, round, and reactive to light.  Cardiovascular:     Rate and Rhythm: Normal rate  and regular rhythm.  Pulmonary:     Effort: Pulmonary effort is normal.  Abdominal:     General: Bowel sounds are normal.  Musculoskeletal:        General: Normal range of motion.  Neurological:     General: No focal deficit present.  Psychiatric:        Mood and Affect: Mood normal.     Data Reviewed:  There are no new results to review at this time.  DG Chest 1 View CLINICAL DATA:  Palpitations patient's  EXAM: CHEST  1 VIEW  COMPARISON:  Repeat exam with nipple markers compared to earlier study of 08/17/2022  FINDINGS: Normal heart size, mediastinal contours, and pulmonary vascularity.  Lungs clear.  No pulmonary infiltrate, pleural effusion, or pneumothorax.  Questioned nodular density on the previous exam is no longer identified; based on change in position versus previous exam this likely represented a nipple shadow.  Artifact from clothing/button projects over the lower LEFT lung.  Osseous structures unremarkable.  IMPRESSION: No acute abnormalities or evidence of pulmonary nodule.  Electronically Signed   By: Lavonia Dana M.D.   On: 08/17/2022 09:38 DG Chest 2 View CLINICAL DATA:  Provided history: Palpitations. Weakness on right side.  EXAM: CHEST - 2 VIEW  COMPARISON:  No pertinent prior exams available for comparison.  FINDINGS: Heart size within normal limits. A 7 mm nodular opacity projects in the region of the lateral left lung base on the PA radiograph (see annotation on image). No appreciable airspace consolidation elsewhere. No evidence of pleural effusion or pneumothorax. No acute bony abnormality identified.  IMPRESSION: A 7 mm nodular opacity projects in the region of the lateral left lung base on the PA radiograph. While this may reflect an asymmetric nipple shadow, a pulmonary nodule cannot be excluded. A repeat PA chest radiograph with nipple markers is recommended. If this finding does not correspond with the left-sided  nipple marker on the follow-up radiograph, a chest CT is recommended for further evaluation.  Otherwise, no evidence of acute cardiopulmonary abnormality.  Electronically Signed   By: Kellie Simmering D.O.   On: 08/17/2022 08:38 CT HEAD WO CONTRAST (5MM) CLINICAL DATA:  Mental status change.  Weakness.  EXAM: CT HEAD WITHOUT CONTRAST  TECHNIQUE: Contiguous axial images were obtained from the base of the skull through the vertex without intravenous contrast.  RADIATION DOSE REDUCTION: This exam was performed according to the departmental dose-optimization program which includes automated exposure control, adjustment of the mA and/or kV according to patient size and/or use of iterative reconstruction technique.  COMPARISON:  None Available.  FINDINGS: Brain: No evidence of acute infarction, hemorrhage, hydrocephalus, extra-axial collection or mass lesion/mass effect.  Vascular: No hyperdense vessel or unexpected calcification.  Skull: Normal. Negative for fracture or focal lesion.  Sinuses/Orbits: Bilateral median antrectomies of been performed. There is also been partial ethmoidectomies. Moderate mucosal thickening  is identified involving the paranasal sinuses.  Other: None.  IMPRESSION: 1. No acute intracranial abnormality. 2. Moderate mucosal thickening involving the paranasal sinuses.  Electronically Signed   By: Kerby Moors M.D.   On: 08/17/2022 08:15   Lab Results  Component Value Date   WBC 11.6 (H) 08/17/2022   HGB 13.1 08/17/2022   HCT 41.1 08/17/2022   MCV 93.6 08/17/2022   PLT 320 [id number]   Last metabolic panel Lab Results  Component Value Date   GLUCOSE 176 (H) 08/17/2022   NA 137 08/17/2022   K 3.6 08/17/2022   CL 105 08/17/2022   CO2 27 08/17/2022   BUN 19 08/17/2022   CREATININE 0.67 08/17/2022   GFRNONAA >60 08/17/2022   CALCIUM 8.6 (L) 08/17/2022   PROT 6.9 08/17/2022   ALBUMIN 3.9 08/17/2022   BILITOT 0.4 08/17/2022   ALKPHOS 80  08/17/2022   AST 19 08/17/2022   ALT 20 08/17/2022   ANIONGAP 5 08/17/2022    Assessment and Plan: * TIA (transient ischemic attack) Positive transient right-sided upper and lower weakness while driving as well as malaise around 630 this morning that last for several minutes Per patient, symptoms progressed resolved by time of EMS evaluation Noted risk factors include borderline diabetes, hyperlipidemia, tobacco abuse CT head within normal limits Will plan for formal TIA evaluation including MRI, MRA, 2D echo, carotid Dopplers, risk stratification labs Start on baby aspirin Discussed tobacco cessation Neuro consult if MRI positive  Follow  Sinusitis Noted white count 11.6 with paranasal inflammation on imaging with malaise in setting of TIA evaluation Will place on doxycycline for sinusitis coverage Risk factors include tobacco abuse Follow  Abnormal EKG Noted nonspecific ST depressions in the inferior leads on EKG Unclear if this is a new issue versus chronic No active chest pain Troponin negative x 2 Pending 2D echo in the setting of TIA evaluation Will otherwise monitor for now Cardiology consult as clinically indicated Follow closely   Type 2 diabetes mellitus (HCC) Blood sugar 120s to 170s on presentation Sliding-scale insulin A1c pending  Hyperlipidemia Titrate Lipitor dosing Lipid panel pending Follow  Tobacco abuse 1/4 pack a day smoker Start nicotine patch Discussed tobacco cessation at length      Advance Care Planning:   Code Status: Not on file   Consults: Pending neurology eval    Family Communication: Husband at the bedside   Severity of Illness: The appropriate patient status for this patient is OBSERVATION. Observation status is judged to be reasonable and necessary in order to provide the required intensity of service to ensure the patient's safety. The patient's presenting symptoms, physical exam findings, and initial radiographic and  laboratory data in the context of their medical condition is felt to place them at decreased risk for further clinical deterioration. Furthermore, it is anticipated that the patient will be medically stable for discharge from the hospital within 2 midnights of admission.   Author: Deneise Lever, MD 08/17/2022 1:13 PM  For on call review [email or web address].

## 2022-08-17 NOTE — ED Provider Notes (Signed)
[facility] Provider Note    Event Date/Time   First MD Initiated Contact with Patient 08/17/22 [phone]     (approximate)   History   Weakness   HPI  Nancy Ryan is a 62 y.o. female with history of hyperlipidemia, diabetes, smoking who comes in with concerns for multiple symptoms.  Patient reports that she went to bed last night around 8 PM felt her normal self.  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  She reports feeling palpitations and her heart, some strange feeling of her right foot, jitteriness over her whole body.  She reports feeling like her right foot was weak and was not able to operate the pedal as well as her right hand.  She reports that she felt fine yesterday prior to going to bed.  She does report that she has been taking some NyQuil for a cold recently.  She denies any headaches, blurred vision, dizziness.  She just reports that something just fell off.  She reports that now her symptoms have mostly resolved.  She denies any weakness to the whole right side or numbness to the right side.   She denies any shortness of breath, falls hitting her head, abdominal pain.  She denies any daily EtOH use, drug use.  Review of records patient was last seen on 06/20/2022 by her primary care doctor.  One of her diagnoses at that time was stress.  Patient reports any history of anxiety but on this note she reported that that she has days where her "brain just goes goes goes"   Physical Exam   Triage Vital Signs: ED Triage Vitals [08/17/22 0740]  Enc Vitals Group     BP      Pulse      Resp      Temp      Temp src      SpO2 99 %     Weight      Height      Head Circumference      Peak Flow      Pain Score      Pain Loc      Pain Edu?      Excl. in [place]?     Most recent vital  signs: Vitals:   08/17/22 0740  SpO2: 99%     General: Awake, no distress.  CV:  Good peripheral perfusion.  Resp:  Normal effort.  Abd:  No distention.  Soft nontender Other:  Cranial nerves II through XII are intact.  Equal strength in arms and legs.  No pronator drift.  Sensation intact throughout.  Finger-nose intact bilaterally.  She does have a slight tremor noted to bilateral arms.   ED Results / Procedures / Treatments   Labs (all labs ordered are listed, but only abnormal results are displayed) Labs Reviewed  RESP PANEL BY RT-PCR (RSV, FLU A&B, COVID)  [id number]  CBC WITH DIFFERENTIAL/PLATELET  COMPREHENSIVE METABOLIC PANEL  TSH  T4, FREE  URINALYSIS, ROUTINE W REFLEX MICROSCOPIC  ETHANOL  URINE DRUG SCREEN, QUALITATIVE ([facility] ONLY)  CK  CBG MONITORING, ED  TROPONIN I (HIGH SENSITIVITY)     EKG  My interpretation of EKG:  Normal sinus rate of 85 without any ST elevation, some mild  depressions in the inferior lateral leads.  No prior EKGs to compare to  Repeat EKG sinus rate of 73 without as significant ST depressions, normal intervals, no T wave inversions  RADIOLOGY I have reviewed the CT head personally interpreted no evidence of intracranial hemorrhage   PROCEDURES:  Critical Care performed: No  .1-3 Lead EKG Interpretation  Performed by: Vanessa [place], MD Authorized by: Vanessa Pierron, MD     Interpretation: normal     ECG rate:  70   ECG rate assessment: normal     Rhythm: sinus rhythm     Ectopy: none     Conduction: normal      MEDICATIONS ORDERED IN ED: Medications  LORazepam (ATIVAN) tablet 0.5 mg (0.5 mg Oral Given 08/17/22 0756)  sodium chloride 0.9 % bolus 1,000 mL (0 mLs Intravenous Stopped 08/17/22 0900)     IMPRESSION / MDM / ASSESSMENT AND PLAN / ED COURSE  I reviewed the triage vital signs and the nursing notes.   Patient's presentation is most consistent with acute presentation with potential threat to life or bodily  function.   Patient comes in with this sensation of feeling off.  Initially reported to nurse is weakness on right side that was in her right foot and right hand.  However the symptoms have since resolved.  NIH stroke scale is currently 0.  Stroke code was not called.  Patient reports symptoms seem to be resolving but does still have some slight tremor but denies any history of anxiety.  Will add on CT imaging to evaluate for intracranial hemorrhage.  EKG with some slight ST depressions.  No prior to compare to.  Will get cardiac markers.  Will look at labs to evaluate for any other cause, urine to evaluate for UTI, COVID  Patient will be given some fluids, PO Ativan  Troponins are negative x 2.  Urine negative UDS negative CBC slightly elevated white count thyroid normal  10:38 AM reevaluated patient, tremors have resolved.  Discussed with patient her abnormal EKG.  She states that she is never had an EKG done denies any exertional chest pain.  She reports complete resolution of symptoms at this time and extra scale remains 0.  Equal strength in arms and legs no numbness or tingling.  Discussed with patient different options including discharge with outpatient cardiology, neurology follow-up versus admission for TIA workup and echocardiogram given her abnormal EKG.  Patient would prefer admission.  Will discuss with hospital team  The patient is on the cardiac monitor to evaluate for evidence of arrhythmia and/or significant heart rate changes.      FINAL CLINICAL IMPRESSION(S) / ED DIAGNOSES   Final diagnoses:  Weakness  Abnormal EKG     Rx / DC Orders   ED Discharge Orders     None        Note:  This document was prepared using Dragon voice recognition software and may include unintentional dictation errors.   Vanessa [place], MD 08/17/22 1146

## 2022-08-17 NOTE — Progress Notes (Signed)
OT Cancellation Note  Patient Details Name: Nancy Ryan MRN: [id number] DOB: September 16, 1960   Cancelled Treatment:    Reason Eval/Treat Not Completed: OT screened, no needs identified, will sign off. Per PT, pt has no deficits; all symptoms have resolved. Pt back to baseline independence.   [name] 08/17/2022, 3:48 PM

## 2022-08-17 NOTE — Assessment & Plan Note (Signed)
Positive transient right-sided upper and lower weakness while driving as well as malaise around 630 this morning that last for several minutes Per patient, symptoms progressed resolved by time of EMS evaluation Noted risk factors include borderline diabetes, hyperlipidemia, tobacco abuse CT head within normal limits Will plan for formal TIA evaluation including MRI, MRA, 2D echo, carotid Dopplers, risk stratification labs Start on baby aspirin Discussed tobacco cessation Neuro consult if MRI positive  Follow

## 2022-08-17 NOTE — Assessment & Plan Note (Signed)
1/4 pack a day smoker Start nicotine patch Discussed tobacco cessation at length

## 2022-08-18 DIAGNOSIS — G459 Transient cerebral ischemic attack, unspecified: Secondary | ICD-10-CM | POA: Diagnosis not present

## 2022-08-18 LAB — ECHOCARDIOGRAM COMPLETE
Area-P 1/2: 3.62 cm2
Height: 60 in
S' Lateral: 3.4 cm
Weight: 1864 oz

## 2022-08-18 LAB — LIPID PANEL
Cholesterol: 144 mg/dL (ref 0–200)
HDL: 32 mg/dL — ABNORMAL LOW (ref >40)
LDL Cholesterol: 96 mg/dL (ref 0–99)
Total CHOL/HDL Ratio: 4.5 RATIO
Triglycerides: 82 mg/dL (ref <150)
VLDL: 16 mg/dL (ref 0–40)

## 2022-08-18 LAB — GLUCOSE, CAPILLARY
Glucose-Capillary: 121 mg/dL — ABNORMAL HIGH (ref 70–99)
Glucose-Capillary: 92 mg/dL (ref 70–99)

## 2022-08-18 MED ORDER — CLOPIDOGREL BISULFATE 75 MG PO TABS: 75.0000 mg | ORAL_TABLET | Freq: Every day | ORAL | Status: DC

## 2022-08-18 MED ORDER — ASPIRIN 81 MG PO TBEC: 81.0000 mg | DELAYED_RELEASE_TABLET | Freq: Every day | ORAL | 3 refills | Status: DC

## 2022-08-18 MED ORDER — ASPIRIN 81 MG PO TBEC: 81.0000 mg | DELAYED_RELEASE_TABLET | Freq: Every day | ORAL | 3 refills | Status: AC

## 2022-08-18 MED ORDER — ENOXAPARIN SODIUM 40 MG/0.4ML IJ SOSY: 40.0000 mg | PREFILLED_SYRINGE | Freq: Every evening | INTRAMUSCULAR | Status: DC

## 2022-08-18 MED ORDER — CLOPIDOGREL BISULFATE 75 MG PO TABS: 75.0000 mg | ORAL_TABLET | Freq: Every day | ORAL | 0 refills | Status: AC

## 2022-08-18 MED ORDER — FAMOTIDINE 20 MG PO TABS: 20.0000 mg | ORAL_TABLET | Freq: Two times a day (BID) | ORAL | 11 refills | Status: DC | PRN

## 2022-08-18 MED ORDER — ATORVASTATIN CALCIUM 80 MG PO TABS: 80.0000 mg | ORAL_TABLET | Freq: Every day | ORAL | 5 refills | Status: DC

## 2022-08-18 MED ORDER — ACETAMINOPHEN 325 MG PO TABS
650.0000 mg | ORAL_TABLET | Freq: Four times a day (QID) | ORAL | Status: DC | PRN
  Administered 2022-08-18: 650 mg via ORAL
  Filled 2022-08-18: qty 2

## 2022-08-18 MED ORDER — CLOPIDOGREL BISULFATE 75 MG PO TABS: 75.0000 mg | ORAL_TABLET | Freq: Every day | ORAL | 0 refills | Status: DC

## 2022-08-18 MED ORDER — CLOPIDOGREL BISULFATE 75 MG PO TABS
300.0000 mg | ORAL_TABLET | Freq: Once | ORAL | Status: AC
  Administered 2022-08-18: 300 mg via ORAL
  Filled 2022-08-18: qty 4

## 2022-08-18 NOTE — Discharge Summary (Signed)
[facility] Discharge Summary   Patient: Nancy Ryan [id number]  PCP: Wayland Denis, PA-C  Date of admission: 08/17/2022   Date of discharge: 08/18/2022     Discharge Diagnoses:  Principal Problem:   TIA (transient ischemic attack) Active Problems:   Tobacco abuse   Hyperlipidemia   Type 2 diabetes mellitus (HCC)   Abnormal EKG   Sinusitis   Admitted From: Home Disposition:  Home   Recommendations for Outpatient Follow-up:  PCP: in 1 wk Neurology in 1-2 weeks  Follow up LABS/TEST:     Diet recommendation: Cardiac diet  Activity: The patient is advised to gradually reintroduce usual activities, as tolerated  Discharge Condition: stable  Code Status: Full code   History of present illness: As per the H and P dictated on admission Hospital Course:  Nancy Ryan is a 62 y.o. female with medical history significant of tobacco abuse, hyperlipidemia, borderline diabetes presenting with TIA.  Patient reports waking up this morning around 5 AM.  Patient states she had felt overall lethargic specially while taking a shower.  Symptoms persisted.  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  Symptoms persisted for multiple minutes.  Symptoms then recurred around 630.  EMS was subsequently called.  Patient denies any fevers or chills.  No chest pain.  No slurred speech or confusion.  Patient has significant difficulty driving the car because of the right upper extremity and lower extremity weakness.  Denies any prior episodes like this in the past.  No vision changes.  No numbness or tingling.  Baseline 1/4 to 1/2 pack/day smoker.  Also baseline hyperlipidemia.  Reports prior borderline diabetes diagnosis has been otherwise monitored outpatient.  No shortness of breath.  No orthopnea or PND. Presented to the ER afebrile, hemodynamically stable.  Labs notable for white  count of 11.6, hemoglobin 13.1.  Blood sugars in 170s to 120s.  Creatinine 0.7.  Troponin negative x 2.  Urinalysis grossly within normal limits.  Urine drug screen pan negative.  COVID flu and RSV negative.  CT head grossly stable apart from moderate mucosal thickening in the paranasal sinuses.  Chest x-ray within normal limits.  EKG with nonspecific ST depressions in the inferior leads.  Otherwise stable. Assessment and Plan:  # TIA (transient ischemic attack) Symptoms resolved, currently patient is asymptomatic. CT head within normal limits. MRI Brain: Unremarkable non-contrast MRI appearance of the brain. No evidence of an acute intracranial abnormality. MRA head: No intracranial large vessel occlusion is identified. Intracranial atherosclerotic disease with multifocal stenoses, most notably as follows. Sites of severe stenosis within the A1 segment of the right anterior cerebral artery. Severe stenosis within the proximal A2 segment of the right anterior cerebral artery. Distal branch atherosclerotic irregularity of the bilateral posterior cerebral arteries. 1-2 mm inferiorly projecting vascular protrusion arising from the supraclinoid left internal carotid artery, which may reflect an aneurysm or infundibulum. MRA neck:The common carotid, internal carotid and vertebral arteries are patent within the neck without stenosis. TTE:  LVEF 60 to 65%, no wall motion abnormality, no evidence of PFO, no any significant valvular abnormality. Discussed with neurology, recommended dual antiplatelet therapy for 3 months.  Patient was loaded with Plavix 300 mg one-time dose started aspirin 81 mg p.o. daily and Plavix 75 mg p.o. daily, Lipitor 80 mg p.o. daily. As per neurology patient does not need Zio patch as patient does not have a stroke and  no arrhythmias noted on the telemetry. Discussed tobacco cessation.  Patient was cleared to discharge and follow-up as an outpatient. # Sinusitis, incidental finding on  MRI.  WBC 11.6, no clinical symptoms.  Follow with PCP in 1 week.  # Abnormal EKG: Noted nonspecific ST depressions in the inferior leads on EKG Unclear if this is a new issue versus chronic. No active chest pain Troponin negative x 2, TTE as above.  Follow with PCP and follow with cardiology if needed. # Prediabetic, hemoglobin A1c 6.3, patient is on diet control, not on any medication at this time.  Advised to continue diabetic diet.   # Hyperlipidemia, LDL 96, patient was on Lipitor 10 mg, increased to 80 mg due to TIA.  Check lipid profile after 3 to 6 months. # Tobacco abuse; 1/4 pack a day smoker, s/p nicotine patch. Discussed tobacco cessation at length   Body mass index is 22.75 kg/m.  Nutrition Interventions:   Patient was ambulatory without any assistance. Patient was seen by physical therapy, who recommended no therapy needed on discharge,  On the day of the discharge the patient's vitals were stable, and no other acute medical condition were reported by patient. the patient was felt safe to be discharge at Home.  Consultants: Neurology Procedures: None  Discharge Exam: General: Appear in no distress, no Rash; Oral Mucosa Clear, moist. Cardiovascular: S1 and S2 Present, no Murmur, Respiratory: normal respiratory effort, Bilateral Air entry present and no Crackles, no wheezes Abdomen: Bowel Sound present, Soft and no tenderness, no hernia Extremities: no Pedal edema, no calf tenderness Neurology: alert and oriented to time, place, and person affect appropriate.  Filed Weights   08/17/22 1516  Weight: 52.8 kg   Vitals:   08/18/22 0827 08/18/22 1147  BP: 117/71 113/65  Pulse: 84 79  Resp: 16 16  Temp: 98.1 F (36.7 C) 98.2 F (36.8 C)  SpO2: 98% 97%    DISCHARGE MEDICATION: Allergies as of 08/18/2022       Reactions   Sulfa Antibiotics         Medication List     STOP taking these medications    erythromycin ophthalmic ointment       TAKE these  medications    aspirin EC 81 MG tablet Take 1 tablet (81 mg total) by mouth daily. Swallow whole. Start taking on: August 19, 2022   atorvastatin 80 MG tablet Commonly known as: LIPITOR Take 1 tablet (80 mg total) by mouth daily. What changed:  medication strength how much to take   cetirizine 10 MG tablet Commonly known as: ZYRTEC Take 10 mg by mouth at bedtime.   clopidogrel 75 MG tablet Commonly known as: PLAVIX Take 1 tablet (75 mg total) by mouth daily. Start taking on: August 19, 2022   famotidine 20 MG tablet Commonly known as: PEPCID Take 1 tablet (20 mg total) by mouth 2 (two) times daily as needed for heartburn or indigestion.   fluticasone 50 MCG/ACT nasal spray Commonly known as: FLONASE Place 2 sprays into both nostrils in the morning and at bedtime.       Allergies  Allergen Reactions   Sulfa Antibiotics    Discharge Instructions     Ambulatory referral to Neurology   Complete by: As directed    An appointment is requested in approximately: 1-2 weeks   Call MD for:   Complete by: As directed    Headache, dizziness, weakness or numbness   Call MD for:  difficulty breathing, headache  or visual disturbances   Complete by: As directed    Call MD for:  extreme fatigue   Complete by: As directed    Call MD for:  persistant dizziness or light-headedness   Complete by: As directed    Call MD for:  severe uncontrolled pain   Complete by: As directed    Diet - low sodium heart healthy   Complete by: As directed    Discharge instructions   Complete by: As directed    Follow-up with PCP in 1 week Follow-up with neurology in 1-2 weeks   Increase activity slowly   Complete by: As directed        The results of significant diagnostics from this hospitalization (including imaging, microbiology, ancillary and laboratory) are listed below for reference.    Significant Diagnostic Studies: ECHOCARDIOGRAM COMPLETE  Result Date: 08/18/2022    ECHOCARDIOGRAM  REPORT   Patient Name:   Rosalind Egelston Date of Exam: 08/17/2022 Medical Rec #:  [id number]    Height:       60.0 in Accession #:    [id number]   Weight:       116.5 lb Date of Birth:  Mar 06, 1961    BSA:          1.484 m Patient Age:    89 years     BP:           125/69 mmHg Patient Gender: F            HR:           113 bpm. Exam Location:  [facility] Procedure: 2D Echo, Cardiac Doppler and Color Doppler Indications:     G45.9 TIA  History:         Patient has no prior history of Echocardiogram examinations.  Sonographer:     Cresenciano Lick RDCS Referring Phys:  [place] Diagnosing Phys: Ida Rogue MD IMPRESSIONS  1. Left ventricular ejection fraction, by estimation, is 60 to 65%. The left ventricle has normal function. The left ventricle has no regional wall motion abnormalities. Left ventricular diastolic parameters were normal.  2. Right ventricular systolic function is normal. The right ventricular size is normal. Tricuspid regurgitation signal is inadequate for assessing PA pressure.  3. The mitral valve is normal in structure. Mild mitral valve regurgitation. No evidence of mitral stenosis.  4. The aortic valve has an indeterminant number of cusps. There is mild calcification of the aortic valve. Aortic valve regurgitation is trivial. Aortic valve sclerosis is present, with no evidence of aortic valve stenosis.  5. The inferior vena cava is normal in size with greater than 50% respiratory variability, suggesting right atrial pressure of 3 mmHg. FINDINGS  Left Ventricle: Left ventricular ejection fraction, by estimation, is 60 to 65%. The left ventricle has normal function. The left ventricle has no regional wall motion abnormalities. The left ventricular internal cavity size was normal in size. There is  no left ventricular hypertrophy. Left ventricular diastolic parameters were normal. Right Ventricle: The right ventricular size is normal. No increase in right ventricular wall thickness. Right  ventricular systolic function is normal. Tricuspid regurgitation signal is inadequate for assessing PA pressure. Left Atrium: Left atrial size was normal in size. Right Atrium: Right atrial size was normal in size. Pericardium: There is no evidence of pericardial effusion. Mitral Valve: The mitral valve is normal in structure. There is mild calcification of the mitral valve leaflet(s). Mild mitral valve regurgitation. No evidence of mitral valve stenosis. Tricuspid  Valve: The tricuspid valve is normal in structure. Tricuspid valve regurgitation is mild . No evidence of tricuspid stenosis. Aortic Valve: The aortic valve has an indeterminant number of cusps. There is mild calcification of the aortic valve. Aortic valve regurgitation is trivial. Aortic valve sclerosis is present, with no evidence of aortic valve stenosis. Pulmonic Valve: The pulmonic valve was normal in structure. Pulmonic valve regurgitation is not visualized. No evidence of pulmonic stenosis. Aorta: The aortic root is normal in size and structure. Venous: The inferior vena cava is normal in size with greater than 50% respiratory variability, suggesting right atrial pressure of 3 mmHg. IAS/Shunts: No atrial level shunt detected by color flow Doppler.  LEFT VENTRICLE PLAX 2D LVIDd:         4.80 cm   Diastology LVIDs:         3.40 cm   LV e' medial:    11.60 cm/s LV PW:         0.90 cm   LV E/e' medial:  7.6 LV IVS:        0.50 cm   LV e' lateral:   11.77 cm/s LVOT diam:     1.80 cm   LV E/e' lateral: 7.5 LV SV:         49 LV SV Index:   33 LVOT Area:     2.54 cm  RIGHT VENTRICLE             IVC RV Basal diam:  2.80 cm     IVC diam: 1.20 cm RV S prime:     10.80 cm/s TAPSE (M-mode): 1.8 cm LEFT ATRIUM             Index        RIGHT ATRIUM           Index LA diam:        3.35 cm 2.26 cm/m   RA Area:     10.40 cm LA Vol (A2C):   33.2 ml 22.38 ml/m  RA Volume:   23.70 ml  15.98 ml/m LA Vol (A4C):   21.3 ml 14.36 ml/m LA Biplane Vol: 27.5 ml 18.54  ml/m  AORTIC VALVE LVOT Vmax:   97.13 cm/s LVOT Vmean:  62.933 cm/s LVOT VTI:    0.193 m  AORTA Ao Root diam: 2.70 cm MITRAL VALVE MV Area (PHT): 3.62 cm    SHUNTS MV Decel Time: 210 msec    Systemic VTI:  0.19 m MV E velocity: 88.30 cm/s  Systemic Diam: 1.80 cm MV A velocity: 61.85 cm/s MV E/A ratio:  1.43 Ida Rogue MD Electronically signed by Ida Rogue MD Signature Date/Time: 08/18/2022/10:59:46 AM    Final    MR BRAIN WO CONTRAST  Result Date: 08/17/2022 CLINICAL DATA:  Provided history: Neuro deficit, acute, stroke suspected. EXAM: MRI HEAD WITHOUT CONTRAST MRA HEAD WITHOUT CONTRAST MRA NECK WITHOUT AND WITH CONTRAST TECHNIQUE: Multiplanar, multi-echo pulse sequences of the brain and surrounding structures were acquired without intravenous contrast. Angiographic images of the Circle of Willis were acquired using MRA technique without intravenous contrast. Angiographic images of the neck were acquired using MRA technique without and with intravenous contrast. Carotid stenosis measurements (when applicable) are obtained utilizing NASCET criteria, using the distal internal carotid diameter as the denominator. CONTRAST:  51m GADAVIST GADOBUTROL 1 MMOL/ML IV SOLN COMPARISON:  Head CT performed earlier today 08/17/2022. FINDINGS: MRI HEAD FINDINGS Brain: No age advanced or lobar predominant parenchymal atrophy. No cortical encephalomalacia is identified. No significant cerebral  white matter disease. There is no acute infarct. No evidence of an intracranial mass. No chronic intracranial blood products. No extra-axial fluid collection. No midline shift. Vascular: Maintained flow voids within the proximal large arterial vessels. Skull and upper cervical spine: No focal suspicious marrow lesion. Incompletely assessed cervical spondylosis. Sinuses/Orbits: No mass or acute finding within the imaged orbits. Postsurgical appearance of the paranasal sinuses. Severe mucosal thickening within the bilateral  maxillary sinuses. Moderate to severe mucosal thickening within the bilateral ethmoid air cells/ethmoidectomy cavities. Mild mucosal thickening within the left frontal sinus. MRA HEAD FINDINGS Anterior circulation: The intracranial internal carotid arteries are patent. The M1 middle cerebral arteries are patent. No M2 proximal branch occlusion or high-grade proximal stenosis. The anterior cerebral arteries are patent. Hypoplastic right A1 segment. Sites of severe stenoses within the right A1 segment. Severe stenosis within the proximal right A2 segment. 1-2 mm inferiorly projecting vascular protrusion arising from the supraclinoid left internal carotid artery, which may reflect an aneurysm or infundibulum (series 1030, image 5). Posterior circulation: The intracranial vertebral arteries are patent. The basilar artery is patent. The posterior cerebral arteries are patent. A left posterior communicating artery is present. Distal branch atherosclerotic irregularity of both posterior cerebral arteries. A left posterior communicating artery is present. The right posterior communicating artery is diminutive or absent. Anatomic variants: As described. MRA NECK FINDINGS Aortic arch: Standard aortic branching. No hemodynamically significant innominate or proximal subclavian artery stenosis. Right carotid system: CCA and ICA patent within the neck without stenosis. Left carotid system: CCA and ICA patent within the neck without stenosis. Vertebral arteries: Vertebral arteries patent within the neck without stenosis. The left vertebral artery is dominant. IMPRESSION: MRI brain: 1. Unremarkable non-contrast MRI appearance of the brain. No evidence of an acute intracranial abnormality. 2. Paranasal sinus disease, as described MRA head: 1. No intracranial large vessel occlusion is identified. 2. Intracranial atherosclerotic disease with multifocal stenoses, most notably as follows. 3. Sites of severe stenosis within the A1  segment of the right anterior cerebral artery. 4. Severe stenosis within the proximal A2 segment of the right anterior cerebral artery. 5. Distal branch atherosclerotic irregularity of the bilateral posterior cerebral arteries. 6. 1-2 mm inferiorly projecting vascular protrusion arising from the supraclinoid left internal carotid artery, which may reflect an aneurysm or infundibulum. MRA neck: The common carotid, internal carotid and vertebral arteries are patent within the neck without stenosis. Electronically Signed   By: Kellie Simmering D.O.   On: 08/17/2022 14:40   MR ANGIO HEAD WO CONTRAST  Result Date: 08/17/2022 CLINICAL DATA:  Provided history: Neuro deficit, acute, stroke suspected. EXAM: MRI HEAD WITHOUT CONTRAST MRA HEAD WITHOUT CONTRAST MRA NECK WITHOUT AND WITH CONTRAST TECHNIQUE: Multiplanar, multi-echo pulse sequences of the brain and surrounding structures were acquired without intravenous contrast. Angiographic images of the Circle of Willis were acquired using MRA technique without intravenous contrast. Angiographic images of the neck were acquired using MRA technique without and with intravenous contrast. Carotid stenosis measurements (when applicable) are obtained utilizing NASCET criteria, using the distal internal carotid diameter as the denominator. CONTRAST:  68m GADAVIST GADOBUTROL 1 MMOL/ML IV SOLN COMPARISON:  Head CT performed earlier today 08/17/2022. FINDINGS: MRI HEAD FINDINGS Brain: No age advanced or lobar predominant parenchymal atrophy. No cortical encephalomalacia is identified. No significant cerebral white matter disease. There is no acute infarct. No evidence of an intracranial mass. No chronic intracranial blood products. No extra-axial fluid collection. No midline shift. Vascular: Maintained flow voids within the proximal large arterial  vessels. Skull and upper cervical spine: No focal suspicious marrow lesion. Incompletely assessed cervical spondylosis. Sinuses/Orbits: No  mass or acute finding within the imaged orbits. Postsurgical appearance of the paranasal sinuses. Severe mucosal thickening within the bilateral maxillary sinuses. Moderate to severe mucosal thickening within the bilateral ethmoid air cells/ethmoidectomy cavities. Mild mucosal thickening within the left frontal sinus. MRA HEAD FINDINGS Anterior circulation: The intracranial internal carotid arteries are patent. The M1 middle cerebral arteries are patent. No M2 proximal branch occlusion or high-grade proximal stenosis. The anterior cerebral arteries are patent. Hypoplastic right A1 segment. Sites of severe stenoses within the right A1 segment. Severe stenosis within the proximal right A2 segment. 1-2 mm inferiorly projecting vascular protrusion arising from the supraclinoid left internal carotid artery, which may reflect an aneurysm or infundibulum (series 1030, image 5). Posterior circulation: The intracranial vertebral arteries are patent. The basilar artery is patent. The posterior cerebral arteries are patent. A left posterior communicating artery is present. Distal branch atherosclerotic irregularity of both posterior cerebral arteries. A left posterior communicating artery is present. The right posterior communicating artery is diminutive or absent. Anatomic variants: As described. MRA NECK FINDINGS Aortic arch: Standard aortic branching. No hemodynamically significant innominate or proximal subclavian artery stenosis. Right carotid system: CCA and ICA patent within the neck without stenosis. Left carotid system: CCA and ICA patent within the neck without stenosis. Vertebral arteries: Vertebral arteries patent within the neck without stenosis. The left vertebral artery is dominant. IMPRESSION: MRI brain: 1. Unremarkable non-contrast MRI appearance of the brain. No evidence of an acute intracranial abnormality. 2. Paranasal sinus disease, as described MRA head: 1. No intracranial large vessel occlusion is  identified. 2. Intracranial atherosclerotic disease with multifocal stenoses, most notably as follows. 3. Sites of severe stenosis within the A1 segment of the right anterior cerebral artery. 4. Severe stenosis within the proximal A2 segment of the right anterior cerebral artery. 5. Distal branch atherosclerotic irregularity of the bilateral posterior cerebral arteries. 6. 1-2 mm inferiorly projecting vascular protrusion arising from the supraclinoid left internal carotid artery, which may reflect an aneurysm or infundibulum. MRA neck: The common carotid, internal carotid and vertebral arteries are patent within the neck without stenosis. Electronically Signed   By: Kellie Simmering D.O.   On: 08/17/2022 14:40   MR ANGIO NECK W WO CONTRAST  Result Date: 08/17/2022 CLINICAL DATA:  Provided history: Neuro deficit, acute, stroke suspected. EXAM: MRI HEAD WITHOUT CONTRAST MRA HEAD WITHOUT CONTRAST MRA NECK WITHOUT AND WITH CONTRAST TECHNIQUE: Multiplanar, multi-echo pulse sequences of the brain and surrounding structures were acquired without intravenous contrast. Angiographic images of the Circle of Willis were acquired using MRA technique without intravenous contrast. Angiographic images of the neck were acquired using MRA technique without and with intravenous contrast. Carotid stenosis measurements (when applicable) are obtained utilizing NASCET criteria, using the distal internal carotid diameter as the denominator. CONTRAST:  55m GADAVIST GADOBUTROL 1 MMOL/ML IV SOLN COMPARISON:  Head CT performed earlier today 08/17/2022. FINDINGS: MRI HEAD FINDINGS Brain: No age advanced or lobar predominant parenchymal atrophy. No cortical encephalomalacia is identified. No significant cerebral white matter disease. There is no acute infarct. No evidence of an intracranial mass. No chronic intracranial blood products. No extra-axial fluid collection. No midline shift. Vascular: Maintained flow voids within the proximal large  arterial vessels. Skull and upper cervical spine: No focal suspicious marrow lesion. Incompletely assessed cervical spondylosis. Sinuses/Orbits: No mass or acute finding within the imaged orbits. Postsurgical appearance of the paranasal sinuses. Severe mucosal  thickening within the bilateral maxillary sinuses. Moderate to severe mucosal thickening within the bilateral ethmoid air cells/ethmoidectomy cavities. Mild mucosal thickening within the left frontal sinus. MRA HEAD FINDINGS Anterior circulation: The intracranial internal carotid arteries are patent. The M1 middle cerebral arteries are patent. No M2 proximal branch occlusion or high-grade proximal stenosis. The anterior cerebral arteries are patent. Hypoplastic right A1 segment. Sites of severe stenoses within the right A1 segment. Severe stenosis within the proximal right A2 segment. 1-2 mm inferiorly projecting vascular protrusion arising from the supraclinoid left internal carotid artery, which may reflect an aneurysm or infundibulum (series 1030, image 5). Posterior circulation: The intracranial vertebral arteries are patent. The basilar artery is patent. The posterior cerebral arteries are patent. A left posterior communicating artery is present. Distal branch atherosclerotic irregularity of both posterior cerebral arteries. A left posterior communicating artery is present. The right posterior communicating artery is diminutive or absent. Anatomic variants: As described. MRA NECK FINDINGS Aortic arch: Standard aortic branching. No hemodynamically significant innominate or proximal subclavian artery stenosis. Right carotid system: CCA and ICA patent within the neck without stenosis. Left carotid system: CCA and ICA patent within the neck without stenosis. Vertebral arteries: Vertebral arteries patent within the neck without stenosis. The left vertebral artery is dominant. IMPRESSION: MRI brain: 1. Unremarkable non-contrast MRI appearance of the brain. No  evidence of an acute intracranial abnormality. 2. Paranasal sinus disease, as described MRA head: 1. No intracranial large vessel occlusion is identified. 2. Intracranial atherosclerotic disease with multifocal stenoses, most notably as follows. 3. Sites of severe stenosis within the A1 segment of the right anterior cerebral artery. 4. Severe stenosis within the proximal A2 segment of the right anterior cerebral artery. 5. Distal branch atherosclerotic irregularity of the bilateral posterior cerebral arteries. 6. 1-2 mm inferiorly projecting vascular protrusion arising from the supraclinoid left internal carotid artery, which may reflect an aneurysm or infundibulum. MRA neck: The common carotid, internal carotid and vertebral arteries are patent within the neck without stenosis. Electronically Signed   By: Kellie Simmering D.O.   On: 08/17/2022 14:40   DG Chest 1 View  Result Date: 08/17/2022 CLINICAL DATA:  Palpitations patient's EXAM: CHEST  1 VIEW COMPARISON:  Repeat exam with nipple markers compared to earlier study of 08/17/2022 FINDINGS: Normal heart size, mediastinal contours, and pulmonary vascularity. Lungs clear. No pulmonary infiltrate, pleural effusion, or pneumothorax. Questioned nodular density on the previous exam is no longer identified; based on change in position versus previous exam this likely represented a nipple shadow. Artifact from clothing/button projects over the lower LEFT lung. Osseous structures unremarkable. IMPRESSION: No acute abnormalities or evidence of pulmonary nodule. Electronically Signed   By: Lavonia Dana M.D.   On: 08/17/2022 09:38   DG Chest 2 View  Result Date: 08/17/2022 CLINICAL DATA:  Provided history: Palpitations. Weakness on right side. EXAM: CHEST - 2 VIEW COMPARISON:  No pertinent prior exams available for comparison. FINDINGS: Heart size within normal limits. A 7 mm nodular opacity projects in the region of the lateral left lung base on the PA radiograph (see  annotation on image). No appreciable airspace consolidation elsewhere. No evidence of pleural effusion or pneumothorax. No acute bony abnormality identified. IMPRESSION: A 7 mm nodular opacity projects in the region of the lateral left lung base on the PA radiograph. While this may reflect an asymmetric nipple shadow, a pulmonary nodule cannot be excluded. A repeat PA chest radiograph with nipple markers is recommended. If this finding does not correspond with  the left-sided nipple marker on the follow-up radiograph, a chest CT is recommended for further evaluation. Otherwise, no evidence of acute cardiopulmonary abnormality. Electronically Signed   By: Kellie Simmering D.O.   On: 08/17/2022 08:38   CT HEAD WO CONTRAST (5MM)  Result Date: 08/17/2022 CLINICAL DATA:  Mental status change.  Weakness. EXAM: CT HEAD WITHOUT CONTRAST TECHNIQUE: Contiguous axial images were obtained from the base of the skull through the vertex without intravenous contrast. RADIATION DOSE REDUCTION: This exam was performed according to the departmental dose-optimization program which includes automated exposure control, adjustment of the mA and/or kV according to patient size and/or use of iterative reconstruction technique. COMPARISON:  None Available. FINDINGS: Brain: No evidence of acute infarction, hemorrhage, hydrocephalus, extra-axial collection or mass lesion/mass effect. Vascular: No hyperdense vessel or unexpected calcification. Skull: Normal. Negative for fracture or focal lesion. Sinuses/Orbits: Bilateral median antrectomies of been performed. There is also been partial ethmoidectomies. Moderate mucosal thickening is identified involving the paranasal sinuses. Other: None. IMPRESSION: 1. No acute intracranial abnormality. 2. Moderate mucosal thickening involving the paranasal sinuses. Electronically Signed   By: Kerby Moors M.D.   On: 08/17/2022 08:15    Microbiology: Recent Results (from the past 240 hour(s))  Resp panel  by RT-PCR (RSV, Flu A&B, Covid) Anterior Nasal Swab     Status: None   Collection Time: 08/17/22  7:44 AM   Specimen: Anterior Nasal Swab  Result Value Ref Range Status   SARS Coronavirus 2 by RT PCR NEGATIVE NEGATIVE Final    Comment: (NOTE) SARS-CoV-2 target nucleic acids are NOT DETECTED.  The SARS-CoV-2 RNA is generally detectable in upper respiratory specimens during the acute phase of infection. The lowest concentration of SARS-CoV-2 viral copies this assay can detect is 138 copies/mL. A negative result does not preclude SARS-Cov-2 infection and should not be used as the sole basis for treatment or other patient management decisions. A negative result may occur with  improper specimen collection/handling, submission of specimen other than nasopharyngeal swab, presence of viral mutation(s) within the areas targeted by this assay, and inadequate number of viral copies(<138 copies/mL). A negative result must be combined with clinical observations, patient history, and epidemiological information. The expected result is Negative.  Fact Sheet for Patients:  EntrepreneurPulse.com.au  Fact Sheet for Healthcare Providers:  IncredibleEmployment.be  This test is no t yet approved or cleared by the [place] FDA and  has been authorized for detection and/or diagnosis of SARS-CoV-2 by FDA under an Emergency Use Authorization (EUA). This EUA will remain  in effect (meaning this test can be used) for the duration of the COVID-19 declaration under Section 564(b)(1) of the Act, 21 U.S.C.section 360bbb-3(b)(1), unless the authorization is terminated  or revoked sooner.       Influenza A by PCR NEGATIVE NEGATIVE Final   Influenza B by PCR NEGATIVE NEGATIVE Final    Comment: (NOTE) The Xpert Xpress SARS-CoV-2/FLU/RSV plus assay is intended as an aid in the diagnosis of influenza from Nasopharyngeal swab specimens and should not be used as a sole  basis for treatment. Nasal washings and aspirates are unacceptable for Xpert Xpress SARS-CoV-2/FLU/RSV testing.  Fact Sheet for Patients: EntrepreneurPulse.com.au  Fact Sheet for Healthcare Providers: IncredibleEmployment.be  This test is not yet approved or cleared by the [place] FDA and has been authorized for detection and/or diagnosis of SARS-CoV-2 by FDA under an Emergency Use Authorization (EUA). This EUA will remain in effect (meaning this test can be used) for the duration of the  COVID-19 declaration under Section 564(b)(1) of the Act, 21 U.S.C. section 360bbb-3(b)(1), unless the authorization is terminated or revoked.     Resp Syncytial Virus by PCR NEGATIVE NEGATIVE Final    Comment: (NOTE) Fact Sheet for Patients: EntrepreneurPulse.com.au  Fact Sheet for Healthcare Providers: IncredibleEmployment.be  This test is not yet approved or cleared by the [place] FDA and has been authorized for detection and/or diagnosis of SARS-CoV-2 by FDA under an Emergency Use Authorization (EUA). This EUA will remain in effect (meaning this test can be used) for the duration of the COVID-19 declaration under Section 564(b)(1) of the Act, 21 U.S.C. section 360bbb-3(b)(1), unless the authorization is terminated or revoked.  Performed at [facility], [address] [place], [place] [address]      Labs: CBC: Recent Labs  Lab 08/17/22 0744  WBC 11.6*  NEUTROABS 8.5*  HGB 13.1  HCT 41.1  MCV 93.6  PLT 99991111   Basic Metabolic Panel: Recent Labs  Lab 08/17/22 0744  NA 137  K 3.6  CL 105  CO2 27  GLUCOSE 176*  BUN 19  CREATININE 0.67  CALCIUM 8.6*   Liver Function Tests: Recent Labs  Lab 08/17/22 0744  AST 19  ALT 20  ALKPHOS 80  BILITOT 0.4  PROT 6.9  ALBUMIN 3.9   No results for input(s): "LIPASE", "AMYLASE" in the last 168 hours. No results for input(s): "AMMONIA"  in the last 168 hours. Cardiac Enzymes: Recent Labs  Lab 08/17/22 0744  CKTOTAL 76   BNP (last 3 results) No results for input(s): "BNP" in the last 8760 hours. CBG: Recent Labs  Lab 08/17/22 1310 08/17/22 1627 08/17/22 1958 08/18/22 0826 08/18/22 1149  GLUCAP 91 92 99 121* 92    Time spent: 35 minutes  Signed:  Val Riles  [facility] 08/18/2022 1:43 PM

## 2022-09-10 ENCOUNTER — Other Ambulatory Visit: Payer: Self-pay

## 2022-09-10 ENCOUNTER — Emergency Department
Admission: EM | Admit: 2022-09-10 | Discharge: 2022-09-10 | Disposition: A | Discharge status: Home / Self Care | Payer: Commercial Managed Care - PPO | Attending: Emergency Medicine | Admitting: Emergency Medicine

## 2022-09-10 ENCOUNTER — Emergency Department: Payer: Commercial Managed Care - PPO

## 2022-09-10 DIAGNOSIS — R531 Weakness: Secondary | ICD-10-CM | POA: Insufficient documentation

## 2022-09-10 LAB — DIFFERENTIAL
Abs Immature Granulocytes: 0.03 10*3/uL (ref 0.00–0.07)
Basophils Absolute: 0.1 10*3/uL (ref 0.0–0.1)
Basophils Relative: 1 %
Eosinophils Absolute: 0.1 10*3/uL (ref 0.0–0.5)
Eosinophils Relative: 1 %
Immature Granulocytes: 0 %
Lymphocytes Relative: 24 %
Lymphs Abs: 2.2 10*3/uL (ref 0.7–4.0)
Monocytes Absolute: 0.6 10*3/uL (ref 0.1–1.0)
Monocytes Relative: 7 %
Neutro Abs: 6.2 10*3/uL (ref 1.7–7.7)
Neutrophils Relative %: 67 %

## 2022-09-10 LAB — CBC
HCT: 41.2 % (ref 36.0–46.0)
Hemoglobin: 13.5 g/dL (ref 12.0–15.0)
MCH: 30.9 pg (ref 26.0–34.0)
MCHC: 32.8 g/dL (ref 30.0–36.0)
MCV: 94.3 fL (ref 80.0–100.0)
Platelets: 360 10*3/uL (ref 150–400)
RBC: 4.37 MIL/uL (ref 3.87–5.11)
RDW: 13.3 % (ref 11.5–15.5)
WBC: 9.2 10*3/uL (ref 4.0–10.5)
nRBC: 0 % (ref 0.0–0.2)

## 2022-09-10 LAB — ETHANOL: Alcohol, Ethyl (B): <10 mg/dL

## 2022-09-10 LAB — COMPREHENSIVE METABOLIC PANEL WITH GFR
ALT: 25 U/L (ref 0–44)
AST: 23 U/L (ref 15–41)
Albumin: 4.3 g/dL (ref 3.5–5.0)
Alkaline Phosphatase: 79 U/L (ref 38–126)
Anion gap: 13 (ref 5–15)
BUN: 18 mg/dL (ref 8–23)
CO2: 24 mmol/L (ref 22–32)
Calcium: 9.5 mg/dL (ref 8.9–10.3)
Chloride: 104 mmol/L (ref 98–111)
Creatinine, Ser: 0.62 mg/dL (ref 0.44–1.00)
GFR, Estimated: >60 mL/min
Glucose, Bld: 135 mg/dL — ABNORMAL HIGH (ref 70–99)
Potassium: 3.9 mmol/L (ref 3.5–5.1)
Sodium: 141 mmol/L (ref 135–145)
Total Bilirubin: 0.7 mg/dL (ref 0.3–1.2)
Total Protein: 7.4 g/dL (ref 6.5–8.1)

## 2022-09-10 LAB — URINALYSIS, ROUTINE W REFLEX MICROSCOPIC
Bilirubin Urine: NEGATIVE
Glucose, UA: NEGATIVE mg/dL
Hgb urine dipstick: NEGATIVE
Ketones, ur: NEGATIVE mg/dL
Leukocytes,Ua: NEGATIVE
Nitrite: NEGATIVE
Protein, ur: NEGATIVE mg/dL
Specific Gravity, Urine: 1.006 (ref 1.005–1.030)
pH: 8 (ref 5.0–8.0)

## 2022-09-10 LAB — PROTIME-INR
INR: 1 (ref 0.8–1.2)
Prothrombin Time: 13.3 seconds (ref 11.4–15.2)

## 2022-09-10 LAB — CBG MONITORING, ED: Glucose-Capillary: 127 mg/dL — ABNORMAL HIGH (ref 70–99)

## 2022-09-10 LAB — APTT: aPTT: 34 s (ref 24–36)

## 2022-09-10 NOTE — ED Triage Notes (Signed)
Pt presents via POV c/o sudden onset of "swimmy headed" and shaking. Reports dx with TIA in March 2024 and was dx on Plavix since. Reports has been taking as prescribed. Reports some improvement of symptoms once husband arrived to house. Reports feeling anxious at this time and is shaking. A&O x4. Denies falling at home.

## 2022-09-10 NOTE — Discharge Instructions (Addendum)
As we discussed, please call [facility] to be seen at the Cardiology clinic early this coming week for OUTPATIENT CARDIAC MONITORING  You can tell them you see Dr. Malvin Johns and were seen in the ER who recommended urgent appointment/arrangement of monitoring  Drink plenty of fluids, avoid excess caffeine  Continue your medications as prescribed

## 2022-09-10 NOTE — ED Provider Notes (Signed)
[facility] Provider Note    Event Date/Time   First MD Initiated Contact with Patient 09/10/22 1132     (approximate)   History   Weakness   HPI  Nancy Ryan is a 62 y.o. female  here with generalized weakness. Pt has h/o recent TIA s/p admission. Reports that earlier today she was sitting down when she experienced acute onset of tingling/lightheadedness sensation along with palpitations. She felt like her arms and legs bilaterally were numb/weak and that she had "tight socks" on her bilateral legs. Sx lasted several mins then resolved. She was able to walk to the bathroom and saw that she had no asymmetry of her face. No CP. NO other complaints. Sx very similar to her recently diagnosed TIA for which she has been on ASA and plavix.       Physical Exam   Triage Vital Signs: ED Triage Vitals [09/10/22 1040]  Enc Vitals Group     BP (!) 147/89     Pulse Rate 76     Resp 20     Temp 97.7 F (36.5 C)     Temp Source Oral     SpO2 100 %     Weight 115 lb (52.2 kg)     Height 5' (1.524 m)     Head Circumference      Peak Flow      Pain Score 0     Pain Loc      Pain Edu?      Excl. in GC?     Most recent vital signs: Vitals:   09/10/22 1040 09/10/22 1407  BP: (!) 147/89   Pulse: 76   Resp: 20   Temp: 97.7 F (36.5 C)   SpO2: 100% 99%     General: Awake, no distress.  CV:  Good peripheral perfusion.RRR. Slight systolic murmur appreciated.  Resp:  Normal work of breathing.  Abd:  No distention.  Other:  CNII-XII intact. Face symmetric. Speech normal. Sensation intact b/l face. Strength 5/5 bl UE and LE. Normal sensation to light touch. Normal gait. No dysmetria.   ED Results / Procedures / Treatments   Labs (all labs ordered are listed, but only abnormal results are displayed) Labs Reviewed  COMPREHENSIVE METABOLIC PANEL - Abnormal; Notable for the following components:      Result Value   Glucose, Bld 135 (*)    All other  components within normal limits  URINALYSIS, ROUTINE W REFLEX MICROSCOPIC - Abnormal; Notable for the following components:   Color, Urine STRAW (*)    APPearance CLEAR (*)    All other components within normal limits  CBG MONITORING, ED - Abnormal; Notable for the following components:   Glucose-Capillary 127 (*)    All other components within normal limits  PROTIME-INR  APTT  CBC  DIFFERENTIAL  ETHANOL     EKG Normal sinus rhythm, VR 72. PR 145, QRS 94, Tc 424. No acute ST elevations or depressions. No ischemia or infarct.   RADIOLOGY CT Head: NAICA   I also independently reviewed and agree with radiologist interpretations.   PROCEDURES:  Critical Care performed: No  .1-3 Lead EKG Interpretation  Performed by: Shaune Pollack, MD Authorized by: Shaune Pollack, MD     Interpretation: normal     ECG rate:  70-90   ECG rate assessment: normal     Rhythm: sinus rhythm     Ectopy: none     Conduction: normal   Comments:  Indication: Weakness     MEDICATIONS ORDERED IN ED: Medications - No data to display   IMPRESSION / MDM / ASSESSMENT AND PLAN / ED COURSE  I reviewed the triage vital signs and the nursing notes.                              Differential diagnosis includes, but is not limited to, TIA, hypoperfusion form arrhythmia, orthostasis, anxiety, electrolyte abnormality, CVA, seizure-like episode  Patient's presentation is most consistent with acute presentation with potential threat to life or bodily function.  The patient is on the cardiac monitor to evaluate for evidence of arrhythmia and/or significant heart rate changes  62 yo F here with transient generalized weakness. Pt has recent h/o admission for same with extensive TIA w/u and initiation of ASA/Plavix. SHe has been compliant with this. She has no focal neuro deficits. Sx seem more so related to possible transient palpitations/arrhythmia given bilateral, generalized sx without focal  deficits. CT head is negative. Labs o/w very reassuring. No apparent emergent pathology. EKG Nonischemic and she was monitored on tele here w/o issue.  Given resolution of symptoms and extensive recent evaluation/work-up with medical optimization, discussed case with Dr. Wilford Corner and feel she is very reasonable to f/u as outpatient with Cardiology and Dr. Malvin Johns. I do think she would benefit from an event monitor and will refer her for this. Return precautions given.   FINAL CLINICAL IMPRESSION(S) / ED DIAGNOSES   Final diagnoses:  Generalized weakness     Rx / DC Orders   ED Discharge Orders     None        Note:  This document was prepared using Dragon voice recognition software and may include unintentional dictation errors.   Shaune Pollack, MD 09/10/22 1409

## 2022-09-16 ENCOUNTER — Other Ambulatory Visit: Payer: Self-pay | Admitting: Internal Medicine

## 2022-09-16 DIAGNOSIS — I2089 Other forms of angina pectoris: Secondary | ICD-10-CM

## 2022-09-27 ENCOUNTER — Other Ambulatory Visit (HOSPITAL_COMMUNITY): Payer: Self-pay | Admitting: *Deleted

## 2022-09-27 ENCOUNTER — Encounter (HOSPITAL_COMMUNITY): Payer: Self-pay

## 2022-09-27 MED ORDER — METOPROLOL TARTRATE 100 MG PO TABS: ORAL_TABLET | ORAL | 0 refills | Status: DC

## 2022-09-28 ENCOUNTER — Telehealth (HOSPITAL_COMMUNITY): Payer: Self-pay | Admitting: *Deleted

## 2022-09-28 NOTE — Telephone Encounter (Signed)
Reaching out to patient to offer assistance regarding upcoming cardiac imaging study; pt verbalizes understanding of appt date/time, parking situation and where to check in, pre-test NPO status and medications ordered, and verified current allergies; name and call back number provided for further questions should they arise ? ?Chesley Valls RN Navigator Cardiac Imaging ?[facility] ?[phone] office ?[phone] cell ? ?Patient to take 100mg metoprolol tartrate two hours prior to her cardiac CT scan.  ?

## 2022-09-29 ENCOUNTER — Ambulatory Visit
Admission: RE | Admit: 2022-09-29 | Discharge: 2022-09-29 | Disposition: A | Discharge status: Home / Self Care | Payer: Commercial Managed Care - PPO | Source: Ambulatory Visit | Attending: Internal Medicine | Admitting: Internal Medicine

## 2022-09-29 DIAGNOSIS — I2089 Other forms of angina pectoris: Secondary | ICD-10-CM | POA: Diagnosis not present

## 2022-09-29 MED ORDER — IOHEXOL 350 MG/ML SOLN
75.0000 mL | Freq: Once | INTRAVENOUS | Status: AC | PRN
  Administered 2022-09-29: 75 mL via INTRAVENOUS

## 2022-09-29 MED ORDER — NITROGLYCERIN 0.4 MG SL SUBL
0.8000 mg | SUBLINGUAL_TABLET | Freq: Once | SUBLINGUAL | Status: AC
  Administered 2022-09-29: 0.8 mg via SUBLINGUAL

## 2022-09-29 NOTE — Progress Notes (Signed)
Patient tolerated procedure well. Ambulate w/o difficulty. Denies any lightheadedness or being dizzy. Pt denies any pain at this time. Sitting in chair, pt is encouraged to drink additional water throughout the day and reason explained to patient. Patient verbalized understanding and all questions answered. ABC intact. No further needs at this time. Discharge from procedure area w/o issues.  

## 2022-11-08 ENCOUNTER — Ambulatory Visit
Admission: RE | Admit: 2022-11-08 | Discharge: 2022-11-08 | Disposition: A | Discharge status: Home / Self Care | Payer: Commercial Managed Care - PPO | Source: Ambulatory Visit | Attending: Student | Admitting: Student

## 2022-11-08 DIAGNOSIS — Z1231 Encounter for screening mammogram for malignant neoplasm of breast: Secondary | ICD-10-CM | POA: Diagnosis present

## 2023-01-04 ENCOUNTER — Other Ambulatory Visit: Payer: Self-pay | Admitting: Physician Assistant

## 2023-01-04 DIAGNOSIS — R29898 Other symptoms and signs involving the musculoskeletal system: Secondary | ICD-10-CM

## 2023-01-04 DIAGNOSIS — I671 Cerebral aneurysm, nonruptured: Secondary | ICD-10-CM

## 2023-01-05 ENCOUNTER — Encounter: Payer: Self-pay | Admitting: Physician Assistant

## 2023-01-09 ENCOUNTER — Ambulatory Visit
Admission: RE | Admit: 2023-01-09 | Discharge: 2023-01-09 | Disposition: A | Discharge status: Home / Self Care | Payer: Commercial Managed Care - PPO | Source: Ambulatory Visit | Attending: Physician Assistant | Admitting: Physician Assistant

## 2023-01-09 DIAGNOSIS — I671 Cerebral aneurysm, nonruptured: Secondary | ICD-10-CM

## 2023-01-09 DIAGNOSIS — R29898 Other symptoms and signs involving the musculoskeletal system: Secondary | ICD-10-CM

## 2023-01-09 MED ORDER — IOPAMIDOL (ISOVUE-370) INJECTION 76%
75.0000 mL | Freq: Once | INTRAVENOUS | Status: AC | PRN
  Administered 2023-01-09: 75 mL via INTRAVENOUS

## 2023-07-10 DIAGNOSIS — F5105 Insomnia due to other mental disorder: Secondary | ICD-10-CM | POA: Diagnosis not present

## 2023-07-10 DIAGNOSIS — F419 Anxiety disorder, unspecified: Secondary | ICD-10-CM | POA: Diagnosis not present

## 2023-07-10 DIAGNOSIS — G459 Transient cerebral ischemic attack, unspecified: Secondary | ICD-10-CM | POA: Diagnosis not present

## 2023-07-19 ENCOUNTER — Other Ambulatory Visit: Payer: Self-pay | Admitting: Physician Assistant

## 2023-07-19 DIAGNOSIS — I671 Cerebral aneurysm, nonruptured: Secondary | ICD-10-CM

## 2023-07-24 ENCOUNTER — Encounter: Payer: Self-pay | Admitting: Physician Assistant

## 2023-07-26 ENCOUNTER — Ambulatory Visit
Admission: RE | Admit: 2023-07-26 | Discharge: 2023-07-26 | Disposition: A | Discharge status: Home / Self Care | Payer: Commercial Managed Care - PPO | Source: Ambulatory Visit | Attending: Physician Assistant | Admitting: Physician Assistant

## 2023-07-26 DIAGNOSIS — R531 Weakness: Secondary | ICD-10-CM | POA: Diagnosis not present

## 2023-07-26 DIAGNOSIS — I671 Cerebral aneurysm, nonruptured: Secondary | ICD-10-CM

## 2023-07-26 DIAGNOSIS — G459 Transient cerebral ischemic attack, unspecified: Secondary | ICD-10-CM | POA: Diagnosis not present

## 2023-07-26 MED ORDER — IOPAMIDOL (ISOVUE-370) INJECTION 76%
75.0000 mL | Freq: Once | INTRAVENOUS | Status: AC | PRN
  Administered 2023-07-26: 75 mL via INTRAVENOUS

## 2023-08-20 DIAGNOSIS — N132 Hydronephrosis with renal and ureteral calculous obstruction: Secondary | ICD-10-CM | POA: Diagnosis not present

## 2023-08-20 DIAGNOSIS — R1012 Left upper quadrant pain: Secondary | ICD-10-CM | POA: Diagnosis not present

## 2023-08-20 DIAGNOSIS — R319 Hematuria, unspecified: Secondary | ICD-10-CM | POA: Diagnosis not present

## 2023-08-20 DIAGNOSIS — D72829 Elevated white blood cell count, unspecified: Secondary | ICD-10-CM | POA: Diagnosis not present

## 2023-08-20 DIAGNOSIS — Z87442 Personal history of urinary calculi: Secondary | ICD-10-CM | POA: Diagnosis not present

## 2023-08-30 ENCOUNTER — Ambulatory Visit
Admission: RE | Admit: 2023-08-30 | Discharge: 2023-08-30 | Disposition: A | Discharge status: Home / Self Care | Source: Ambulatory Visit | Attending: Urology | Admitting: Urology

## 2023-08-30 ENCOUNTER — Ambulatory Visit
Admission: RE | Admit: 2023-08-30 | Discharge: 2023-08-30 | Disposition: A | Discharge status: Home / Self Care | Attending: Urology | Admitting: Urology

## 2023-08-30 ENCOUNTER — Ambulatory Visit (INDEPENDENT_AMBULATORY_CARE_PROVIDER_SITE_OTHER): Admitting: Urology

## 2023-08-30 ENCOUNTER — Encounter: Payer: Self-pay | Admitting: Urology

## 2023-08-30 VITALS — BP 126/77 | HR 81 | Ht 60.0 in | Wt 116.8 lb

## 2023-08-30 DIAGNOSIS — N201 Calculus of ureter: Secondary | ICD-10-CM

## 2023-08-30 NOTE — Patient Instructions (Signed)
 Marland Kitchen

## 2023-08-30 NOTE — Progress Notes (Signed)
 08/30/23 11:06 AM   Nancy Ryan 10-10-1960 [id number]  CC: Left distal ureteral stone  HPI: 63 year old female with distant history of kidney stone over 30 years ago who presented to [facility] on 08/20/2023 with left-sided flank pain, and CT showed a 5 mm left distal ureteral stone with mild hydronephrosis.  There is images are not available for me to personally review.  She was on Flomax and doxycycline, urine culture was ultimately negative.  She has not had any pain since that time, but she never saw a stone pass.   PMH: Past Medical History:  Diagnosis Date   Kidney stone     Surgical History: Past Surgical History:  Procedure Laterality Date   BREAST LUMPECTOMY Right    excisional bx-2012, scatr tissue   COLONOSCOPY WITH PROPOFOL N/A 01/02/2017   Procedure: COLONOSCOPY WITH PROPOFOL;  Surgeon: Scot Jun, MD;  Location[facility] ENDOSCOPY;  Service: Endoscopy;  Laterality: N/A;   FUNCTIONAL ENDOSCOPIC SINUS SURGERY      Family History: Family History  Problem Relation Age of Onset   Breast cancer Neg Hx     Social History:  reports that she has been smoking cigarettes. She has a 10 pack-year smoking history. She has never used smokeless tobacco. She reports that she does not drink alcohol and does not use drugs.  Physical Exam: BP 126/77 (BP Location: Left Arm, Patient Position: Sitting, Cuff Size: Normal)   Pulse 81   Ht 5' (1.524 m)   Wt 116 lb 12.8 oz (53 kg)   LMP 01/02/2009   SpO2 95%   BMI 22.81 kg/m    Constitutional:  Alert and oriented, No acute distress. Cardiovascular: No clubbing, cyanosis, or edema. Respiratory: Normal respiratory effort, no increased work of breathing. GI: Abdomen is soft, nontender, nondistended, no abdominal masses   Laboratory Data: Reviewed, see HPI and care everywhere  Pertinent Imaging: CT from [facility] 08/20/2023 report suggesting 5 mm left distal ureteral stone, images unavailable to  personally review  I personally viewed and interpreted the KUB today that shows no evidence of radiopaque stones in the pelvis  Assessment & Plan:   63 year old female who presented to [facility] on 08/20/2023 with left-sided flank pain and CT showed a 5 mm left distal ureteral stone.  Urine culture was ultimately negative.  Her pain has resolved but she never saw stone pass.  We discussed various treatment options for urolithiasis including observation with or without medical expulsive therapy, shockwave lithotripsy (SWL), ureteroscopy and laser lithotripsy with stent placement, and percutaneous nephrolithotomy.We discussed that management is based on stone size, location, density, patient co-morbidities, and patient preference. Stones <38mm in size have a >80% spontaneous passage rate. Data surrounding the use of tamsulosin for medical expulsive therapy is controversial, but meta analyses suggests it is most efficacious for distal stones between 5-75mm in size. Possible side effects include dizziness/lightheadedness, and retrograde ejaculation.SWL has a lower stone free rate in a single procedure, but also a lower complication rate compared to ureteroscopy and avoids a stent and associated stent related symptoms. Possible complications include renal hematoma, steinstrasse, and need for additional treatment.Ureteroscopy with laser lithotripsy and stent placement has a higher stone free rate than SWL in a single procedure, however increased complication rate including possible infection, ureteral injury, bleeding, and stent related morbidity. Common stent related symptoms include dysuria, urgency/frequency, and flank pain.  We discussed general stone prevention strategies including adequate hydration with goal of producing 2.5 L of urine daily,  increasing citric acid intake, increasing calcium intake during high oxalate meals, minimizing animal protein, and decreasing salt intake. Information about  dietary recommendations given today.   Based on KUB today that shows no evidence of stone disease high suspicion she passed the stone, return precautions were discussed, she can follow-up with urology as needed  Legrand Rams, MD 08/30/2023  [facility] [address] [place], [place] [address] [phone]

## 2023-11-09 DIAGNOSIS — I672 Cerebral atherosclerosis: Secondary | ICD-10-CM | POA: Diagnosis not present

## 2023-11-09 DIAGNOSIS — E782 Mixed hyperlipidemia: Secondary | ICD-10-CM | POA: Diagnosis not present

## 2023-11-09 DIAGNOSIS — F410 Panic disorder [episodic paroxysmal anxiety] without agoraphobia: Secondary | ICD-10-CM | POA: Diagnosis not present

## 2023-11-09 DIAGNOSIS — G459 Transient cerebral ischemic attack, unspecified: Secondary | ICD-10-CM | POA: Diagnosis not present

## 2023-11-09 DIAGNOSIS — E119 Type 2 diabetes mellitus without complications: Secondary | ICD-10-CM | POA: Diagnosis not present

## 2023-11-09 DIAGNOSIS — F17200 Nicotine dependence, unspecified, uncomplicated: Secondary | ICD-10-CM | POA: Diagnosis not present

## 2023-12-19 DIAGNOSIS — S60455A Superficial foreign body of left ring finger, initial encounter: Secondary | ICD-10-CM | POA: Diagnosis not present

## 2023-12-19 DIAGNOSIS — S61235A Puncture wound without foreign body of left ring finger without damage to nail, initial encounter: Secondary | ICD-10-CM | POA: Diagnosis not present

## 2023-12-19 DIAGNOSIS — Y93D2 Activity, sewing: Secondary | ICD-10-CM | POA: Diagnosis not present

## 2023-12-19 DIAGNOSIS — S61245A Puncture wound with foreign body of left ring finger without damage to nail, initial encounter: Secondary | ICD-10-CM | POA: Diagnosis not present

## 2024-07-09 ENCOUNTER — Other Ambulatory Visit: Payer: Self-pay | Admitting: Student

## 2024-07-09 DIAGNOSIS — Z1231 Encounter for screening mammogram for malignant neoplasm of breast: Secondary | ICD-10-CM

## 2024-08-27 ENCOUNTER — Encounter
